# Patient Record
Sex: Male | Born: 1945 | Race: Black or African American | Hispanic: No
Health system: Midwestern US, Community
[De-identification: ages and names within clinical notes are randomized; demographics above are authoritative.]

## PROBLEM LIST (undated history)

## (undated) DIAGNOSIS — IMO0001 Reserved for inherently not codable concepts without codable children: Secondary | ICD-10-CM

## (undated) DIAGNOSIS — T7840XA Allergy, unspecified, initial encounter: Secondary | ICD-10-CM

## (undated) DIAGNOSIS — E538 Deficiency of other specified B group vitamins: Secondary | ICD-10-CM

## (undated) DIAGNOSIS — I10 Essential (primary) hypertension: Secondary | ICD-10-CM

## (undated) DIAGNOSIS — R972 Elevated prostate specific antigen [PSA]: Secondary | ICD-10-CM

## (undated) DIAGNOSIS — D759 Disease of blood and blood-forming organs, unspecified: Secondary | ICD-10-CM

## (undated) DIAGNOSIS — E785 Hyperlipidemia, unspecified: Secondary | ICD-10-CM

## (undated) DIAGNOSIS — C61 Malignant neoplasm of prostate: Secondary | ICD-10-CM

## (undated) DIAGNOSIS — R7303 Prediabetes: Secondary | ICD-10-CM

## (undated) HISTORY — PX: WISDOM TOOTH EXTRACTION: SHX21

## (undated) HISTORY — DX: Disease of blood and blood-forming organs, unspecified: D75.9

## (undated) HISTORY — DX: Allergy, unspecified, initial encounter: T78.40XA

## (undated) HISTORY — DX: Elevated prostate specific antigen (PSA): R97.20

## (undated) HISTORY — DX: Hyperlipidemia, unspecified: E78.5

## (undated) HISTORY — PX: PROSTATE BIOPSY: SHX241

## (undated) HISTORY — DX: Malignant neoplasm of prostate: C61

## (undated) HISTORY — DX: Prediabetes: R73.03

## (undated) HISTORY — DX: Deficiency of other specified B group vitamins: E53.8

## (undated) HISTORY — DX: Essential (primary) hypertension: I10

## (undated) HISTORY — DX: Reserved for inherently not codable concepts without codable children: IMO0001

---

## 2004-02-15 ENCOUNTER — Encounter: Admission: RE | Admit: 2004-02-15 | Discharge: 2004-02-15 | Payer: Self-pay | Admitting: Family Medicine

## 2004-04-08 ENCOUNTER — Encounter: Admission: RE | Admit: 2004-04-08 | Discharge: 2004-04-08 | Payer: Self-pay | Admitting: Internal Medicine

## 2006-07-15 IMAGING — CT CT CHEST W/O CM
1 of 2 series · 16 of 32 positions shown, 20 images · non-contrast
Comparison: none

CLINICAL DATA: Abnormal chest x-ray.  History of asbestos exposure.
 CT OF THE CHEST WITHOUT CONTRAST
TECHNIQUE: Multidetector helical imaging carried out through the chest without contrast.  This exam is correlated with the chest x-ray dated 02/15/04.
 No lung masses of significance are noted.  On image #38, there are two tiny calcifications in the lateral aspect of the right lower lobe consistent with granulomata.  There is some mild pleural thickening noted on the left posteriorly.  No pleural masses or calcified plaque.  No pleural or pericardial fluid.  No definite adenopathy given the limitation of scanning without contrast. 
 IMPRESSION
 1.  Minimal pleural thickening and a few scattered calcified granulomata.  
 2.  No pleural or parenchymal masses.
 3.  No calcified pleural plaques.

[Series 2: — · axial · 0.74mm/px · z∈[-325,-25]mm · 16 of 70 slices shown, 20 images]
[im 5/70  mediastinal]
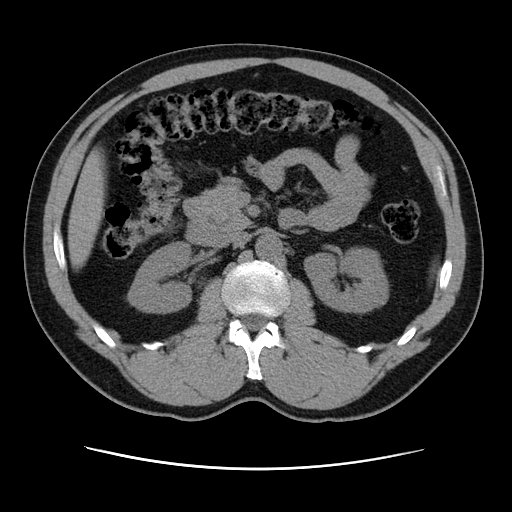
[im 5/70  lung]
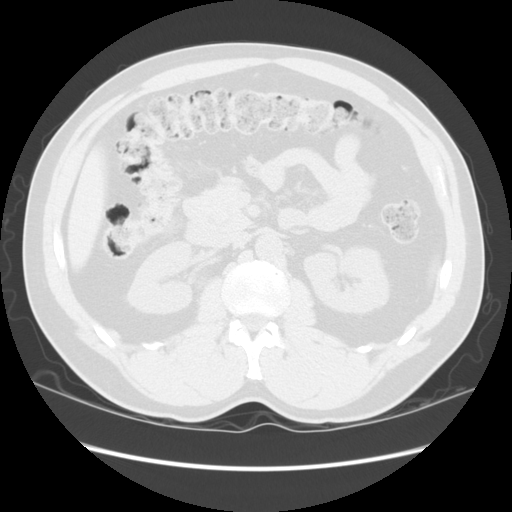
[im 10/70  lung]
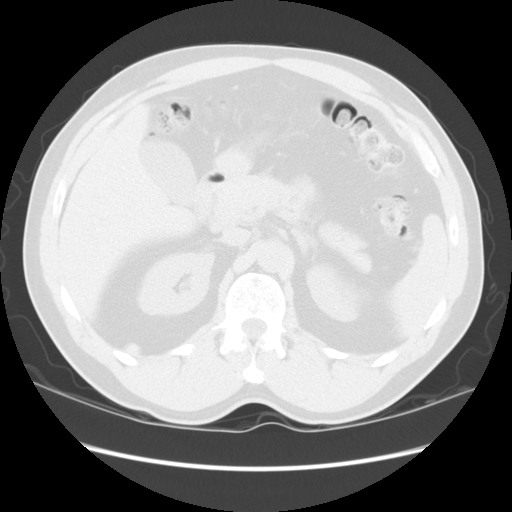
[im 14/70  lung]
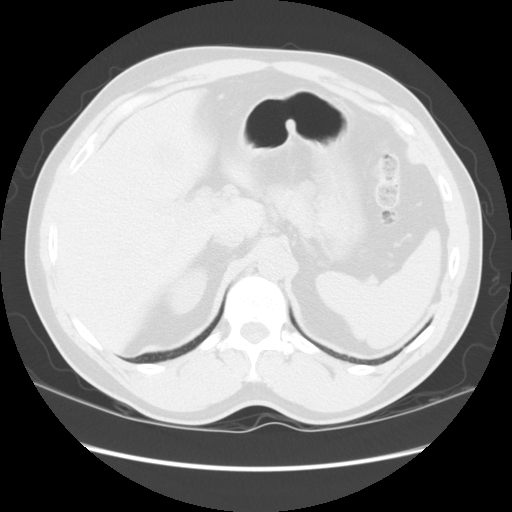
[im 19/70  lung]
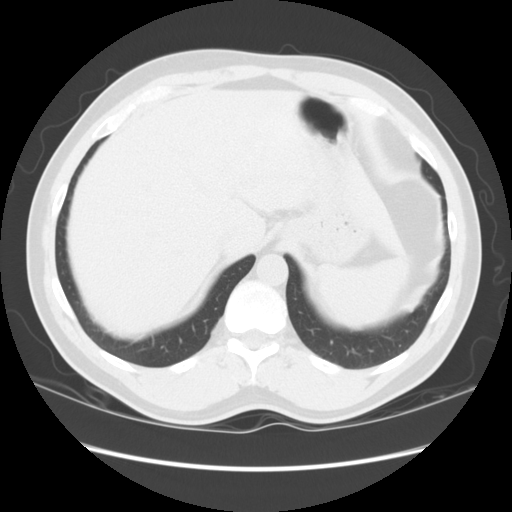
[im 24/70  mediastinal]
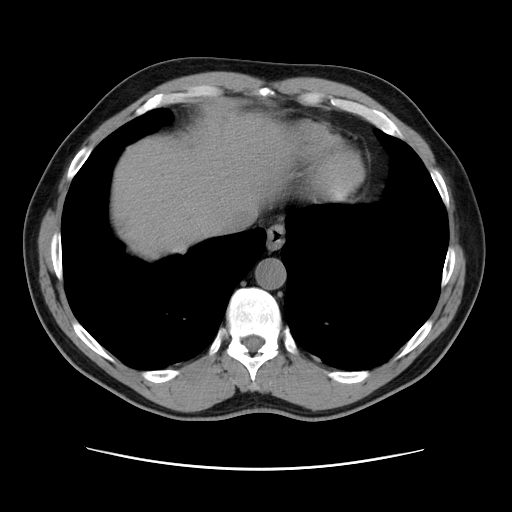
[im 24/70  lung]
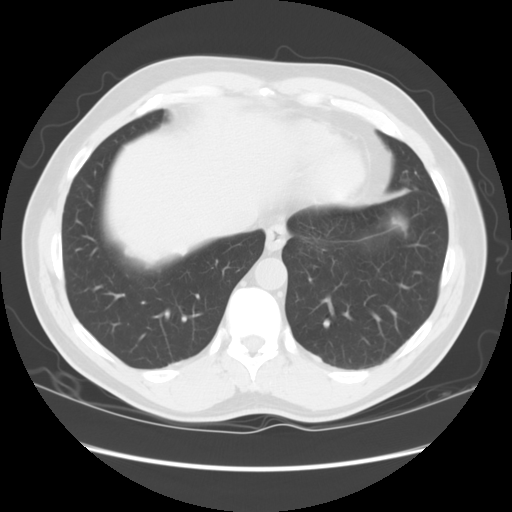
[im 28/70  lung]
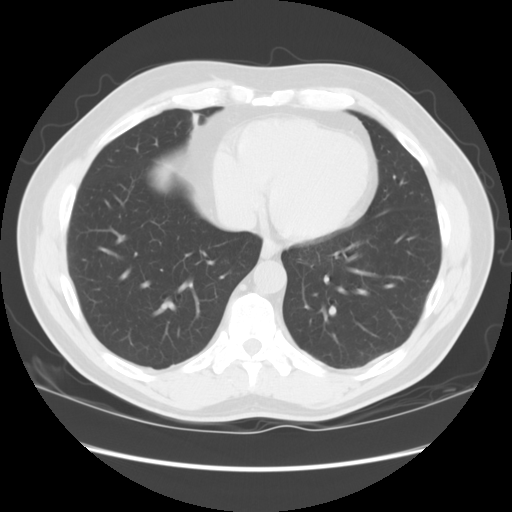
[im 33/70  lung]
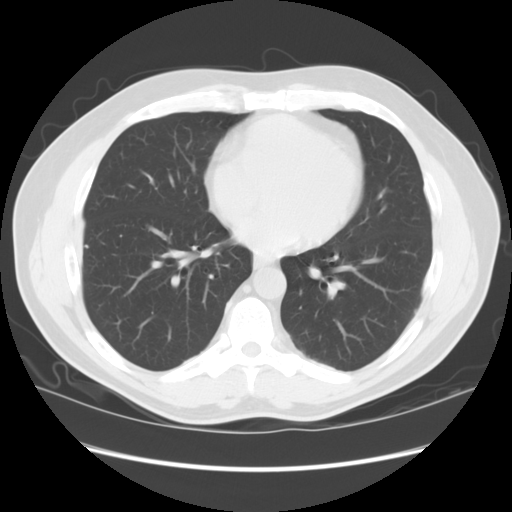
[im 34/70  lung]
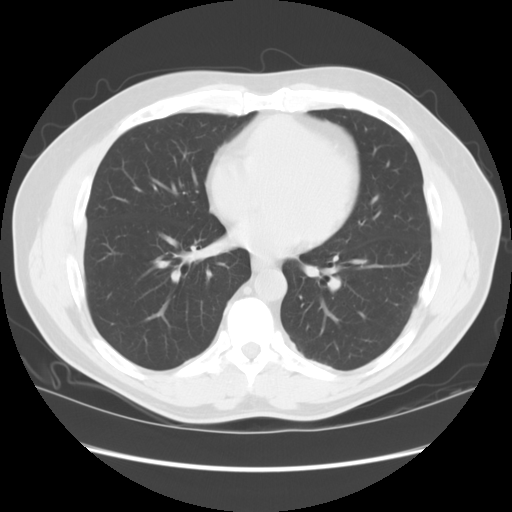
[im 35/70  mediastinal]
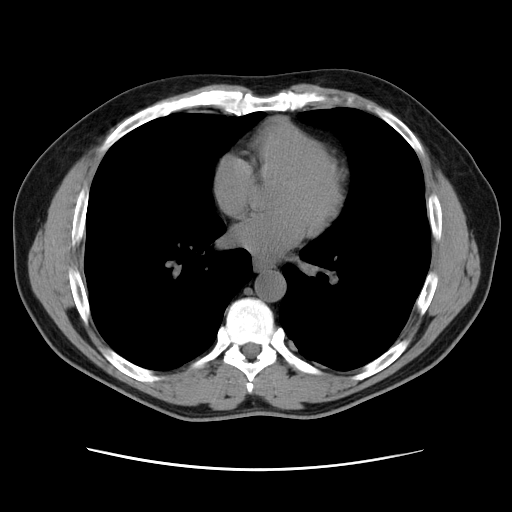
[im 35/70  lung]
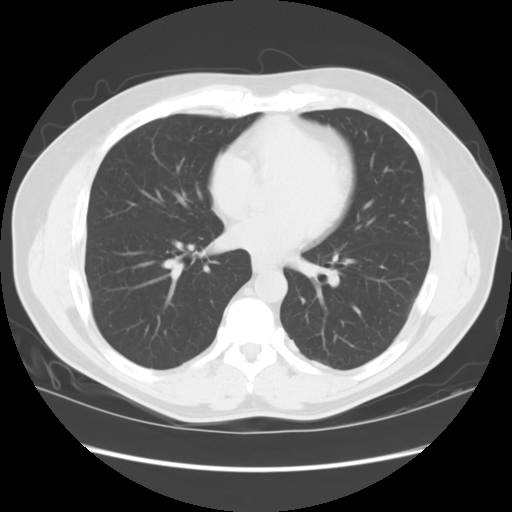
[im 37/70  lung]
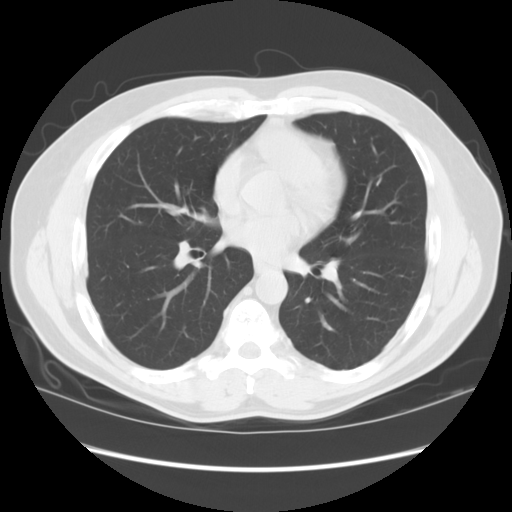
[im 42/70  lung]
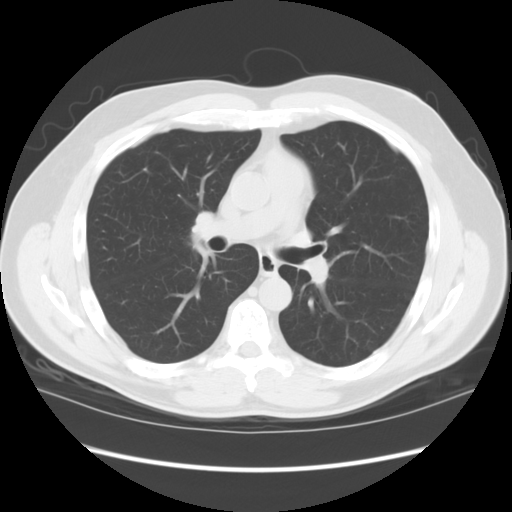
[im 47/70  lung]
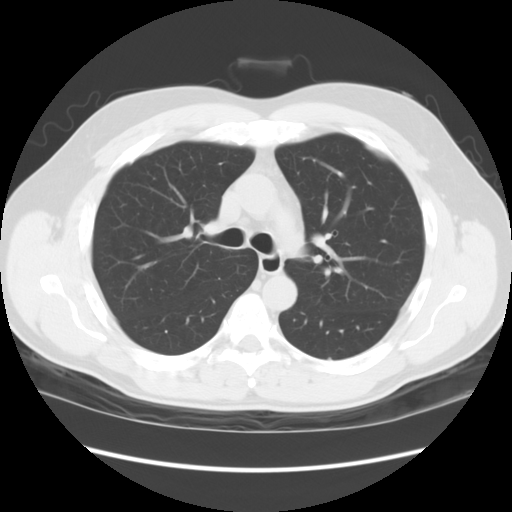
[im 51/70  mediastinal]
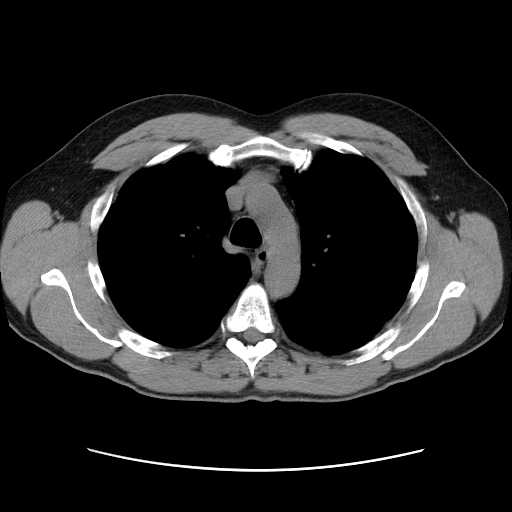
[im 51/70  lung]
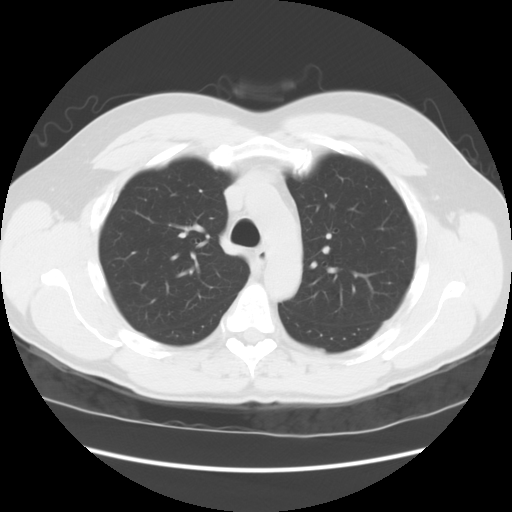
[im 56/70  lung]
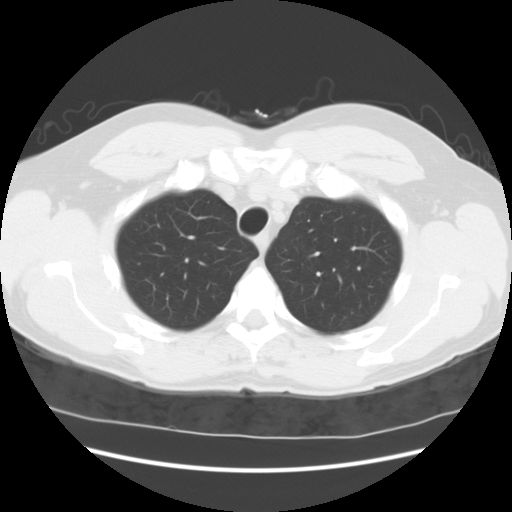
[im 60/70  lung]
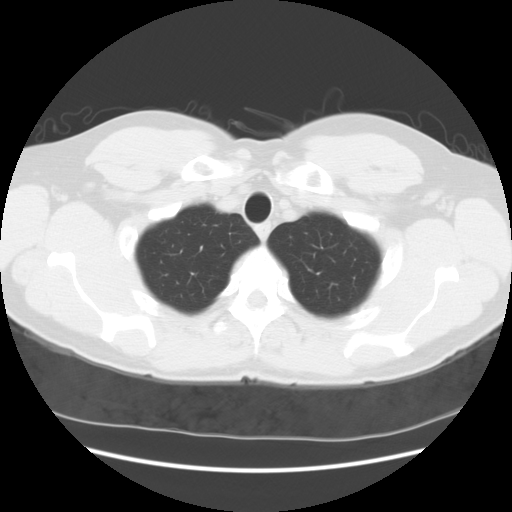
[im 65/70  lung]
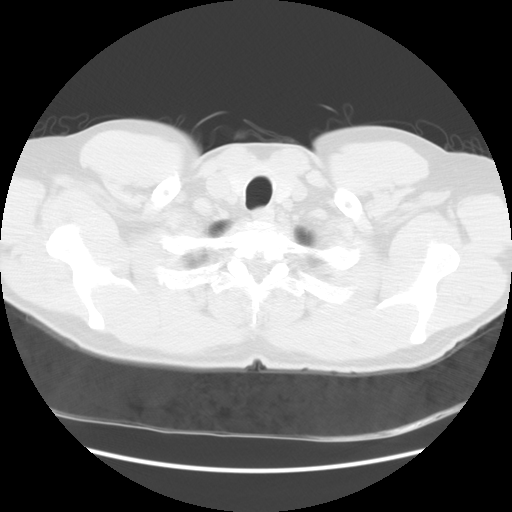

[16 of 32 positions shown; findings below may reference images not displayed]

## 2006-10-19 ENCOUNTER — Ambulatory Visit: Payer: Self-pay | Admitting: Family Medicine

## 2006-10-19 LAB — CONVERTED CEMR LAB
CO2: 34 meq/L — ABNORMAL HIGH (ref 19–32)
Calcium: 10 mg/dL (ref 8.4–10.5)
Chloride: 99 meq/L (ref 96–112)
Cholesterol: 149 mg/dL (ref 0–200)
Creatinine, Ser: 1.1 mg/dL (ref 0.4–1.5)
GFR calc Af Amer: 88 mL/min
Glucose, Bld: 95 mg/dL (ref 70–99)
LDL Cholesterol: 90 mg/dL (ref 0–99)
Potassium: 4 meq/L (ref 3.5–5.1)
Sodium: 140 meq/L (ref 135–145)
Total CHOL/HDL Ratio: 3.5
Triglycerides: 80 mg/dL (ref 0–149)

## 2006-11-24 ENCOUNTER — Ambulatory Visit (HOSPITAL_BASED_OUTPATIENT_CLINIC_OR_DEPARTMENT_OTHER): Admission: RE | Admit: 2006-11-24 | Discharge: 2006-11-24 | Payer: Self-pay | Admitting: Orthopedic Surgery

## 2006-11-24 ENCOUNTER — Encounter (INDEPENDENT_AMBULATORY_CARE_PROVIDER_SITE_OTHER): Payer: Self-pay | Admitting: Specialist

## 2006-12-28 ENCOUNTER — Encounter (INDEPENDENT_AMBULATORY_CARE_PROVIDER_SITE_OTHER): Payer: Self-pay | Admitting: Family Medicine

## 2007-03-09 ENCOUNTER — Ambulatory Visit: Payer: Self-pay | Admitting: Family Medicine

## 2007-03-09 DIAGNOSIS — E785 Hyperlipidemia, unspecified: Secondary | ICD-10-CM | POA: Insufficient documentation

## 2007-03-09 DIAGNOSIS — I10 Essential (primary) hypertension: Secondary | ICD-10-CM

## 2007-03-09 LAB — CONVERTED CEMR LAB
ALT: 23 units/L (ref 0–53)
AST: 23 units/L (ref 0–37)
Direct LDL: 182.1 mg/dL
GFR calc Af Amer: 88 mL/min
GFR calc non Af Amer: 73 mL/min
HDL: 42.5 mg/dL (ref >39.0)
PSA: 1.17 ng/mL (ref 0.10–4.00)

## 2007-03-10 ENCOUNTER — Telehealth (INDEPENDENT_AMBULATORY_CARE_PROVIDER_SITE_OTHER): Payer: Self-pay | Admitting: *Deleted

## 2007-04-14 ENCOUNTER — Ambulatory Visit: Payer: Self-pay | Admitting: Internal Medicine

## 2007-04-26 ENCOUNTER — Ambulatory Visit: Payer: Self-pay | Admitting: Family Medicine

## 2007-04-26 LAB — CONVERTED CEMR LAB
BUN: 22 mg/dL (ref 6–23)
CO2: 33 meq/L — ABNORMAL HIGH (ref 19–32)
Calcium: 9.6 mg/dL (ref 8.4–10.5)
Chloride: 104 meq/L (ref 96–112)
GFR calc non Af Amer: 66 mL/min

## 2007-04-27 ENCOUNTER — Telehealth (INDEPENDENT_AMBULATORY_CARE_PROVIDER_SITE_OTHER): Payer: Self-pay | Admitting: *Deleted

## 2007-04-29 ENCOUNTER — Encounter (INDEPENDENT_AMBULATORY_CARE_PROVIDER_SITE_OTHER): Payer: Self-pay | Admitting: Family Medicine

## 2007-04-29 ENCOUNTER — Ambulatory Visit: Payer: Self-pay | Admitting: Internal Medicine

## 2007-05-11 LAB — HM COLONOSCOPY

## 2007-05-26 ENCOUNTER — Ambulatory Visit: Payer: Self-pay | Admitting: Family Medicine

## 2007-05-26 LAB — CONVERTED CEMR LAB
CO2: 31 meq/L (ref 19–32)
Chloride: 106 meq/L (ref 96–112)
Creatinine, Ser: 1.1 mg/dL (ref 0.4–1.5)
GFR calc non Af Amer: 73 mL/min
Sodium: 143 meq/L (ref 135–145)

## 2007-05-30 ENCOUNTER — Telehealth (INDEPENDENT_AMBULATORY_CARE_PROVIDER_SITE_OTHER): Payer: Self-pay | Admitting: *Deleted

## 2007-06-27 ENCOUNTER — Ambulatory Visit: Payer: Self-pay | Admitting: Family Medicine

## 2007-06-30 ENCOUNTER — Telehealth (INDEPENDENT_AMBULATORY_CARE_PROVIDER_SITE_OTHER): Payer: Self-pay | Admitting: *Deleted

## 2007-06-30 LAB — CONVERTED CEMR LAB
Cholesterol: 153 mg/dL (ref 0–200)
LDL Cholesterol: 93 mg/dL (ref 0–99)
Total CHOL/HDL Ratio: 3.5
Triglycerides: 84 mg/dL (ref 0–149)
VLDL: 17 mg/dL (ref 0–40)

## 2007-08-25 ENCOUNTER — Ambulatory Visit: Payer: Self-pay | Admitting: Family Medicine

## 2009-01-05 DIAGNOSIS — R7303 Prediabetes: Secondary | ICD-10-CM

## 2009-01-05 HISTORY — DX: Prediabetes: R73.03

## 2009-01-23 ENCOUNTER — Ambulatory Visit: Payer: Self-pay | Admitting: Internal Medicine

## 2009-01-24 ENCOUNTER — Ambulatory Visit: Payer: Self-pay | Admitting: Internal Medicine

## 2009-01-28 ENCOUNTER — Telehealth (INDEPENDENT_AMBULATORY_CARE_PROVIDER_SITE_OTHER): Payer: Self-pay | Admitting: *Deleted

## 2009-01-30 ENCOUNTER — Ambulatory Visit: Payer: Self-pay | Admitting: Internal Medicine

## 2009-01-30 LAB — CONVERTED CEMR LAB: Hgb A1c MFr Bld: 5.9 % (ref 4.6–6.5)

## 2009-02-06 ENCOUNTER — Telehealth: Payer: Self-pay | Admitting: Internal Medicine

## 2009-02-06 LAB — CONVERTED CEMR LAB
ALT: 24 units/L (ref 0–53)
BUN: 13 mg/dL (ref 6–23)
Calcium: 9.6 mg/dL (ref 8.4–10.5)
Chloride: 106 meq/L (ref 96–112)
Cholesterol: 224 mg/dL — ABNORMAL HIGH (ref 0–200)
Creatinine, Ser: 1.3 mg/dL (ref 0.4–1.5)
GFR calc non Af Amer: 71.91 mL/min (ref >60)
Potassium: 3.7 meq/L (ref 3.5–5.1)
Sodium: 141 meq/L (ref 135–145)
Triglycerides: 86 mg/dL (ref 0.0–149.0)
VLDL: 17.2 mg/dL (ref 0.0–40.0)

## 2009-05-06 ENCOUNTER — Ambulatory Visit: Payer: Self-pay | Admitting: Internal Medicine

## 2009-05-06 DIAGNOSIS — R739 Hyperglycemia, unspecified: Secondary | ICD-10-CM

## 2009-05-10 LAB — CONVERTED CEMR LAB
Basophils Absolute: 0 10*3/uL (ref 0.0–0.1)
Eosinophils Absolute: 0.1 10*3/uL (ref 0.0–0.7)
HCT: 40.2 % (ref 39.0–52.0)
HDL: 48.4 mg/dL (ref >39.00)
Hgb A1c MFr Bld: 5.9 % (ref 4.6–6.5)
LDL Cholesterol: 84 mg/dL (ref 0–99)
Lymphocytes Relative: 31.3 % (ref 12.0–46.0)
MCV: 86.3 fL (ref 78.0–100.0)
Neutro Abs: 1.8 10*3/uL (ref 1.4–7.7)
RBC: 4.66 M/uL (ref 4.22–5.81)
VLDL: 11.2 mg/dL (ref 0.0–40.0)
WBC: 3.3 10*3/uL — ABNORMAL LOW (ref 4.5–10.5)

## 2009-05-31 ENCOUNTER — Telehealth (INDEPENDENT_AMBULATORY_CARE_PROVIDER_SITE_OTHER): Payer: Self-pay | Admitting: *Deleted

## 2009-06-06 ENCOUNTER — Telehealth (INDEPENDENT_AMBULATORY_CARE_PROVIDER_SITE_OTHER): Payer: Self-pay | Admitting: *Deleted

## 2009-06-06 ENCOUNTER — Ambulatory Visit: Payer: Self-pay | Admitting: Internal Medicine

## 2009-06-07 LAB — CONVERTED CEMR LAB
Eosinophils Absolute: 0.1 10*3/uL (ref 0.0–0.7)
Eosinophils Relative: 1.9 % (ref 0.0–5.0)
HCT: 39 % (ref 39.0–52.0)
Lymphocytes Relative: 29.3 % (ref 12.0–46.0)
MCV: 87.3 fL (ref 78.0–100.0)
Monocytes Absolute: 0.2 10*3/uL (ref 0.1–1.0)
Neutro Abs: 1.6 10*3/uL (ref 1.4–7.7)
Neutrophils Relative %: 61.7 % (ref 43.0–77.0)
WBC: 2.7 10*3/uL — ABNORMAL LOW (ref 4.5–10.5)

## 2009-08-08 ENCOUNTER — Ambulatory Visit: Payer: Self-pay | Admitting: Family Medicine

## 2009-11-27 ENCOUNTER — Ambulatory Visit: Payer: Self-pay | Admitting: Internal Medicine

## 2009-11-27 DIAGNOSIS — N529 Male erectile dysfunction, unspecified: Secondary | ICD-10-CM

## 2009-11-27 DIAGNOSIS — D649 Anemia, unspecified: Secondary | ICD-10-CM | POA: Insufficient documentation

## 2009-12-03 DIAGNOSIS — D72819 Decreased white blood cell count, unspecified: Secondary | ICD-10-CM | POA: Insufficient documentation

## 2009-12-03 LAB — CONVERTED CEMR LAB
Hgb A1c MFr Bld: 6 % (ref 4.6–6.5)
Lymphs Abs: 0.9 10*3/uL (ref 0.7–4.0)
MCHC: 33 g/dL (ref 30.0–36.0)
MCV: 87.7 fL (ref 78.0–100.0)
Monocytes Relative: 13.9 % — ABNORMAL HIGH (ref 3.0–12.0)
Neutrophils Relative %: 36.6 % — ABNORMAL LOW (ref 43.0–77.0)
Platelets: 193 10*3/uL (ref 150.0–400.0)
RBC: 4.51 M/uL (ref 4.22–5.81)
WBC: 2.1 10*3/uL — ABNORMAL LOW (ref 4.5–10.5)

## 2009-12-04 ENCOUNTER — Ambulatory Visit: Payer: Self-pay | Admitting: Internal Medicine

## 2009-12-04 ENCOUNTER — Ambulatory Visit: Payer: Self-pay | Admitting: Hematology & Oncology

## 2009-12-09 ENCOUNTER — Encounter: Payer: Self-pay | Admitting: Internal Medicine

## 2009-12-09 LAB — CBC WITH DIFFERENTIAL (CANCER CENTER ONLY)
BASO#: 0 10*3/uL (ref 0.0–0.2)
Eosinophils Absolute: 0.1 10*3/uL (ref 0.0–0.5)
HCT: 37.7 % — ABNORMAL LOW (ref 38.7–49.9)
HGB: 12.7 g/dL — ABNORMAL LOW (ref 13.0–17.1)
LYMPH#: 1 10*3/uL (ref 0.9–3.3)
LYMPH%: 26.5 % (ref 14.0–48.0)
MCV: 85 fL (ref 82–98)
MONO#: 0.2 10*3/uL (ref 0.1–0.9)
NEUT#: 2.4 10*3/uL (ref 1.5–6.5)
Platelets: 223 10*3/uL (ref 145–400)
RBC: 4.45 10*6/uL (ref 4.20–5.70)
RDW: 11.6 % (ref 10.5–14.6)
WBC: 3.6 10*3/uL — ABNORMAL LOW (ref 4.0–10.0)

## 2010-01-03 ENCOUNTER — Ambulatory Visit: Payer: Self-pay | Admitting: Internal Medicine

## 2010-02-04 ENCOUNTER — Ambulatory Visit: Payer: Self-pay | Admitting: Internal Medicine

## 2010-03-07 ENCOUNTER — Ambulatory Visit: Payer: Self-pay | Admitting: Internal Medicine

## 2010-04-09 ENCOUNTER — Ambulatory Visit: Payer: Self-pay | Admitting: Internal Medicine

## 2010-04-18 ENCOUNTER — Ambulatory Visit: Payer: Self-pay | Admitting: Hematology & Oncology

## 2010-04-30 ENCOUNTER — Ambulatory Visit: Payer: Self-pay | Admitting: Internal Medicine

## 2010-04-30 DIAGNOSIS — R9431 Abnormal electrocardiogram [ECG] [EKG]: Secondary | ICD-10-CM | POA: Insufficient documentation

## 2010-04-30 DIAGNOSIS — E538 Deficiency of other specified B group vitamins: Secondary | ICD-10-CM

## 2010-05-16 ENCOUNTER — Ambulatory Visit: Payer: Self-pay | Admitting: Internal Medicine

## 2010-06-13 ENCOUNTER — Ambulatory Visit: Payer: Self-pay | Admitting: Internal Medicine

## 2010-07-15 ENCOUNTER — Ambulatory Visit: Payer: Self-pay | Admitting: Internal Medicine

## 2010-08-15 ENCOUNTER — Ambulatory Visit: Payer: Self-pay | Admitting: Internal Medicine

## 2010-09-22 ENCOUNTER — Telehealth (INDEPENDENT_AMBULATORY_CARE_PROVIDER_SITE_OTHER): Payer: Self-pay | Admitting: *Deleted

## 2010-10-05 LAB — CONVERTED CEMR LAB
BUN: 14 mg/dL (ref 6–23)
Basophils Relative: 0.5 % (ref 0.0–3.0)
CO2: 30 meq/L (ref 19–32)
Calcium: 9.6 mg/dL (ref 8.4–10.5)
Cholesterol: 142 mg/dL (ref 0–200)
Glucose, Bld: 98 mg/dL (ref 70–99)
HCT: 40.5 % (ref 39.0–52.0)
Hgb A1c MFr Bld: 6 % (ref 4.6–6.5)
LDL Cholesterol: 85 mg/dL (ref 0–99)
Lymphs Abs: 0.9 10*3/uL (ref 0.7–4.0)
MCV: 86.4 fL (ref 78.0–100.0)
Monocytes Relative: 10.9 % (ref 3.0–12.0)
Neutro Abs: 1.7 10*3/uL (ref 1.4–7.7)
PSA: 1.22 ng/mL (ref 0.10–4.00)
Platelets: 208 10*3/uL (ref 150.0–400.0)
RBC: 4.68 M/uL (ref 4.22–5.81)
RDW: 13.6 % (ref 11.5–14.6)
Sodium: 142 meq/L (ref 135–145)
Triglycerides: 38 mg/dL (ref 0.0–149.0)
VLDL: 7.6 mg/dL (ref 0.0–40.0)

## 2010-10-07 NOTE — Assessment & Plan Note (Signed)
Summary: b12 inj//lch   Nurse Visit   Allergies: No Known Drug Allergies  Medication Administration  Injection # 1:    Medication: Vit B12 1000 mcg    Diagnosis: ANEMIA (ICD-285.9)    Route: IM    Site: L deltoid    Exp Date: 11/2011    Lot #: [id number]    Mfr: [employer] Regent    Patient tolerated injection without complications    Given by: Army Fossa CMA (March 07, 2010 3:37 PM)  Orders Added: 1)  Vit B12 1000 mcg [J3420] 2)  Admin of Therapeutic Inj  intramuscular or subcutaneous [60454]

## 2010-10-07 NOTE — Assessment & Plan Note (Signed)
Summary: CPX AND FASTING LABS////SPH   Vital Signs:  Patient profile:   65 year old male Height:      72 inches Weight:      193.50 pounds BMI:     26.34 Pulse rate:   69 / minute Pulse rhythm:   regular BP sitting:   144 / 88  (left arm) Cuff size:   large  Vitals Entered By: Army Fossa CMA (April 30, 2010 8:05 AM) CC: CPX: fasting    History of Present Illness: CPX  Current Medications (verified): 1)  Fish Oil Concentrate 1000 Mg  Caps (Omega-3 Fatty Acids) .... Take As Directed. 2)  Baby Aspirin 81 Mg  Chew (Aspirin) .... Take As Directed. 3)  Vytorin 10-20 Mg Tabs (Ezetimibe-Simvastatin) .Marland Kitchen.. 1 By Mouth At Bedtime 4)  Viagra 100 Mg Tabs (Sildenafil Citrate) .... 1/2 or 1 By Mouth Once Daily As Needed 5)  Amoxicillin 500 Mg Caps (Amoxicillin) .... Per Dentist  Allergies (verified): No Known Drug Allergies  Past History:  Past Medical History: Hyperlipidemia Hypertension Borderline DM A1C 5.9  (May 2010) clinical cytopenia, saw hematology 4/11, observation for now B12 deficiency, mild  Past Surgical History: Reviewed history from 01/23/2009 and no changes required. no major  Family History: Reviewed history from 05/06/2009 and no changes required. DM-- M CAD-- GF age 41, MI at mid 12s  Colon ca--  no prostate ca-- no  Social History: Occupation: Pensions consultant for colostomy products Married 2 children Never Smoked Alcohol use-no Drug use-no Regular exercise-- no diet--improved, lower fat, more greens   Review of Systems General:  Denies fatigue, fever, and weight loss. CV:  Denies chest pain or discomfort, palpitations, and swelling of feet. Resp:  Denies cough and shortness of breath. GI:  Denies bloody stools, nausea, and vomiting. GU:  Denies dysuria, urinary frequency, and urinary hesitancy; occasional  urgency . Psych:  Denies anxiety and depression.  Physical Exam  General:  alert, well-developed, and well-nourished.   Neck:  no  masses, no thyromegaly, and normal carotid upstroke.   Lungs:  normal respiratory effort, no intercostal retractions, no accessory muscle use, and normal breath sounds.   Heart:  normal rate, regular rhythm, no murmur, and no gallop.   Abdomen:  soft, non-tender, no distention, no masses, no guarding, and no rigidity.   Rectal:  No external abnormalities noted. Normal sphincter tone. No rectal masses or tenderness. Prostate:  Prostate gland firm and smooth, no enlargement, nodularity, tenderness, mass, asymmetry or induration. Extremities:  no pretibial edema bilaterally  Psych:  Cognition and judgment appear intact. Alert and cooperative with normal attention span and concentration.  not anxious appearing and not depressed appearing.     Impression & Recommendations:  Problem # 1:  WELL ADULT (ICD-V70.0)  Td 2008 pneumonia shot today (h/o DM) s/p several Cscopes per pt , last Cscope 04-2007 normal (Dr Westley Gambles), next -----10 years  labs  EKG shows sinus bradycardia. Lateral T wave inversions. No old EKGs. patient is asymptomatic.  discussed diet-exercise     Orders: Venipuncture (16109) TLB-Lipid Panel (80061-LIPID) TLB-PSA (Prostate Specific Antigen) (84153-PSA) TLB-BMP (Basic Metabolic Panel-BMET) (80048-METABOL) TLB-Hepatic/Liver Function Pnl (80076-HEPATIC) TLB-CBC Platelet - w/Differential (85025-CBCD) Specimen Handling (60454)  Problem # 2:  ELECTROCARDIOGRAM, ABNORMAL (ICD-794.31)  EKG shows sinus bradycardia. Lateral T wave inversions. No old EKGs. patient is asymptomatic.  Plan: Recheck EKG on  return to the office.  Orders: EKG w/ Interpretation (93000)  Complete Medication List: 1)  Fish Oil Concentrate 1000 Mg Caps (Omega-3 fatty  acids) .... Take as directed. 2)  Baby Aspirin 81 Mg Chew (Aspirin) .... Take as directed. 3)  Vytorin 10-20 Mg Tabs (Ezetimibe-simvastatin) .Marland Kitchen.. 1 by mouth at bedtime 4)  Viagra 100 Mg Tabs (Sildenafil citrate) .... 1/2 or 1 by mouth once  daily as needed 5)  Amoxicillin 500 Mg Caps (Amoxicillin) .... Per dentist  Other Orders: TLB-A1C / Hgb A1C (Glycohemoglobin) (83036-A1C) TLB-B12 + Folate Pnl (16109_60454-U98/JXB) Pneumococcal Vaccine (14782) Admin 1st Vaccine (95621)  Patient Instructions: 1)  Please schedule a follow-up appointment in 6 months .        Immunizations Administered:  Pneumonia Vaccine:    Vaccine Type: Pneumovax    Site: left deltoid    Mfr: Merck    Dose: 0.5 ml    Route: IM    Given by: Army Fossa CMA    Exp. Date: 09/24/2011    Lot #: [id number]

## 2010-10-07 NOTE — Consult Note (Signed)
[facility]   Imported By: Lanelle Bal 01/03/2010 11:52:09  _____________________________________________________________________  External Attachment:    Type:   Image     Comment:   External Document

## 2010-10-07 NOTE — Assessment & Plan Note (Signed)
Summary: b12 shot/kdc   Nurse Visit   Allergies: No Known Drug Allergies  Medication Administration  Injection # 1:    Medication: Vit B12 1000 mcg    Diagnosis: ANEMIA (ICD-285.9)    Route: IM    Site: L deltoid    Exp Date: 10/2011    Lot #: [id number]    Patient tolerated injection without complications    Given by: Shary Decamp (January 03, 2010 3:28 PM)  Orders Added: 1)  Vit B12 1000 mcg [J3420] 2)  Admin of Therapeutic Inj  intramuscular or subcutaneous [96372]   Medication Administration  Injection # 1:    Medication: Vit B12 1000 mcg    Diagnosis: ANEMIA (ICD-285.9)    Route: IM    Site: L deltoid    Exp Date: 10/2011    Lot #: [id number]    Patient tolerated injection without complications    Given by: Shary Decamp (January 03, 2010 3:28 PM)  Orders Added: 1)  Vit B12 1000 mcg [J3420] 2)  Admin of Therapeutic Inj  intramuscular or subcutaneous [16109]

## 2010-10-07 NOTE — Assessment & Plan Note (Signed)
Summary: B12/DRB   Nurse Visit  CC: B-12 inj./kb   Allergies: No Known Drug Allergies  Medication Administration  Injection # 1:    Medication: Vit B12 1000 mcg    Diagnosis: B12 DEFICIENCY (ICD-266.2)    Route: IM    Site: R deltoid    Exp Date: 11/06/2011    Lot #: [id number]    Mfr: [employer]    Patient tolerated injection without complications    Given by: Lucious Groves CMA (June 13, 2010 4:01 PM)  Orders Added: 1)  Vit B12 1000 mcg [J3420] 2)  Admin of Therapeutic Inj  intramuscular or subcutaneous [16109]

## 2010-10-07 NOTE — Assessment & Plan Note (Signed)
Summary: B-12//PH   Nurse Visit  CC: Vitamin B-12 inj./kb   Allergies: No Known Drug Allergies  Medication Administration  Injection # 1:    Medication: Vit B12 1000 mcg    Diagnosis: B12 DEFICIENCY (ICD-266.2)    Route: IM    Site: L deltoid    Exp Date: 11/06/2011    Lot #: [id number]    Mfr: [employer]ent    Patient tolerated injection without complications    Given by: Lucious Groves CMA (May 16, 2010 4:00 PM)  Orders Added: 1)  Vit B12 1000 mcg [J3420] 2)  Admin of Therapeutic Inj  intramuscular or subcutaneous [91478]

## 2010-10-07 NOTE — Assessment & Plan Note (Signed)
Summary: B-12 SHOT////SPH   Nurse Visit   Allergies: No Known Drug Allergies  Medication Administration  Injection # 1:    Medication: Vit B12 1000 mcg    Diagnosis: ANEMIA (ICD-285.9)    Route: IM    Site: L deltoid    Exp Date: 10/2011    Lot #: [id number]    Patient tolerated injection without complications    Given by: Shary Decamp (Feb 04, 2010 3:41 PM)  Orders Added: 1)  Vit B12 1000 mcg [J3420] 2)  Admin of Therapeutic Inj  intramuscular or subcutaneous [09811]

## 2010-10-07 NOTE — Assessment & Plan Note (Signed)
Summary: B12/KN   Nurse Visit   Allergies: No Known Drug Allergies  Medication Administration  Injection # 1:    Medication: Vit B12 1000 mcg    Diagnosis: ANEMIA (ICD-285.9)    Route: IM    Site: L deltoid    Exp Date: 11/2011    Lot #: [id number]    Mfr: [employer]    Patient tolerated injection without complications    Given by: Army Fossa CMA (April 09, 2010 3:33 PM)  Orders Added: 1)  Vit B12 1000 mcg [J3420] 2)  Admin of Therapeutic Inj  intramuscular or subcutaneous [16109]

## 2010-10-07 NOTE — Assessment & Plan Note (Signed)
Summary: 6 MONTH OV//PH   Vital Signs:  Patient profile:   65 year old male Height:      72 inches Weight:      194.4 pounds Pulse rate:   82 / minute BP sitting:   148 / 86  Vitals Entered By: Shary Decamp (November 27, 2009 3:32 PM) CC: rov - no concerns   History of Present Illness: routine office visit Hyperlipidemia good medication compliance  diet is healthy not exercising  recently   Hypertension-- no ambulatory BPs   Borderline DM -- on no meds    Current Medications (verified): 1)  Fish Oil Concentrate 1000 Mg  Caps (Omega-3 Fatty Acids) .... Take As Directed. 2)  Baby Aspirin 81 Mg  Chew (Aspirin) .... Take As Directed. 3)  Vytorin 10-20 Mg Tabs (Ezetimibe-Simvastatin) .Marland Kitchen.. 1 By Mouth At Bedtime  Allergies (verified): No Known Drug Allergies  Past History:  Past Medical History: Reviewed history from 05/06/2009 and no changes required. Hyperlipidemia Hypertension Borderline DM A1C 5.9  (May 2010)  Past Surgical History: Reviewed history from 01/23/2009 and no changes required. no major  Social History: Reviewed history from 01/23/2009 and no changes required. 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 GU:  also complained of a long history of difficulty with erections he tried Viagra remotely without apparent side effects, would like to try again.  No history of heart disease.  Physical Exam  General:  alert and well-developed.   Lungs:  normal respiratory effort, no intercostal retractions, no accessory muscle use, and normal breath sounds.   Heart:  normal rate, regular rhythm, no murmur, and no gallop.   Extremities:  no pretibial edema bilaterally    Impression & Recommendations:  Problem # 1:  ANEMIA (ICD-285.9) see previous CBCs labs     Orders: Venipuncture  (45409) TLB-B12 + Folate Pnl (81191_47829-F62/ZHY) TLB-IBC Pnl (Iron/FE;Transferrin) (83550-IBC) TLB-CBC Platelet - w/Differential (85025-CBCD)  Problem # 2:  DIABETES MELLITUS, BORDERLINE (ICD-790.29) borderline elevation of sugars, encourage diet and exercise, labs Labs Reviewed: Creat: 1.3 (01/24/2009)     Orders: TLB-A1C / Hgb A1C (Glycohemoglobin) (83036-A1C)  Problem # 3:  ERECTILE DYSFUNCTION, ORGANIC (ICD-607.84) Assessment: New see ROS, likes to try Viagra one more time plan: Trial  w/ Viagra again side effects, interaction with nitroglycerin and how to use Viagra was discussed with the patient  His updated medication list for this problem includes:    Viagra 100 Mg Tabs (Sildenafil citrate) .Marland Kitchen... 1/2 or 1 by mouth once daily as needed  Problem # 4:  HYPERTENSION (ICD-401.9) on no medication, see instructions BP today: 148/86 Prior BP: 130/82 (08/08/2009)  Labs Reviewed: K+: 3.7 (01/24/2009) Creat: : 1.3 (01/24/2009)   Chol: 144 (05/06/2009)   HDL: 48.40 (05/06/2009)   LDL: 84 (05/06/2009)   TG: 56.0 (05/06/2009)  Complete Medication List: 1)  Fish Oil Concentrate 1000 Mg Caps (Omega-3 fatty acids) .... Take as directed. 2)  Baby Aspirin 81 Mg Chew (Aspirin) .... Take as directed. 3)  Vytorin 10-20 Mg Tabs (Ezetimibe-simvastatin) .Marland Kitchen.. 1 by mouth at bedtime 4)  Viagra 100 Mg Tabs (Sildenafil citrate) .... 1/2 or 1 by mouth once daily as needed  Patient Instructions: 1)  Check your blood pressure 2 or 3 times a week. If it is more than 140/85 consistently,please let us know  2)  Please  schedule a follow-up appointment in 5  months  (physical) Prescriptions: VIAGRA 100 MG TABS (SILDENAFIL CITRATE) 1/2 or 1 by mouth once daily as needed  #10 x 0   Entered and Authorized by:   Dennis Quick E. Celest Reitz MD   Signed by:   Dennis Rod. Kentrell Hallahan MD on 11/27/2009   Method used:   Print then Give to Patient   RxID:   [id number]

## 2010-10-07 NOTE — Assessment & Plan Note (Signed)
Summary: B12/KN   Nurse Visit  CC: B-12 inj./kb   Allergies: No Known Drug Allergies  Medication Administration  Injection # 1:    Medication: Vit B12 1000 mcg    Diagnosis: B12 DEFICIENCY (ICD-266.2)    Route: IM    Site: L deltoid    Exp Date: 11/06/2010    Lot #: [id number]    Mfr: [employer] Regent    Patient tolerated injection without complications    Given by: Lucious Groves CMA (July 15, 2010 3:47 PM)  Orders Added: 1)  Vit B12 1000 mcg [J3420] 2)  Admin of Therapeutic Inj  intramuscular or subcutaneous [16109]

## 2010-10-07 NOTE — Assessment & Plan Note (Signed)
Summary: b12/swh   Nurse Visit   Allergies: No Known Drug Allergies  Medication Administration  Injection # 1:    Medication: Vit B12 1000 mcg    Diagnosis: ANEMIA (ICD-285.9)    Route: IM    Site: L deltoid    Exp Date: 07/09/2011    Lot #: [id number]    Mfr: [employer] Regent    Given by: Doristine Devoid (December 04, 2009 3:55 PM)  Orders Added: 1)  Vit B12 1000 mcg [J3420] 2)  Admin of Therapeutic Inj  intramuscular or subcutaneous [96372]   Medication Administration  Injection # 1:    Medication: Vit B12 1000 mcg    Diagnosis: ANEMIA (ICD-285.9)    Route: IM    Site: L deltoid    Exp Date: 07/09/2011    Lot #: [id number]    Mfr: [employer] Regent    Given by: Doristine Devoid (December 04, 2009 3:55 PM)  Orders Added: 1)  Vit B12 1000 mcg [J3420] 2)  Admin of Therapeutic Inj  intramuscular or subcutaneous [52841]

## 2010-10-09 NOTE — Progress Notes (Signed)
Summary: Refill Request  Phone Note Refill Request Call back at [phone] Message from:  Pharmacy on September 22, 2010 8:13 AM  Refills Requested: Medication #1:  VYTORIN 10-20 MG TABS 1 by mouth at bedtime   Dosage confirmed as above?Dosage Confirmed   Supply Requested: 90   Last Refilled: 04/16/2010 [facility] [address] in [place]  Next Appointment Scheduled: 2.20.12 Initial call taken by: Harold Barban,  September 22, 2010 8:13 AM    Prescriptions: VYTORIN 10-20 MG TABS (EZETIMIBE-SIMVASTATIN) 1 by mouth at bedtime  #90 x 0   Entered by:   Army Fossa CMA   Authorized by:   Nolon Rod. Paz MD   Signed by:   Army Fossa CMA on 09/22/2010   Method used:   Electronically to        [facility] [address] (retail)       [address]       [place], [place]  [address]       Ph: [phone]       Fax: [phone]   RxID:   [id number]

## 2010-10-27 ENCOUNTER — Ambulatory Visit (INDEPENDENT_AMBULATORY_CARE_PROVIDER_SITE_OTHER): Payer: BC Managed Care – PPO | Admitting: Internal Medicine

## 2010-10-27 ENCOUNTER — Encounter: Payer: Self-pay | Admitting: Internal Medicine

## 2010-10-27 ENCOUNTER — Other Ambulatory Visit: Payer: Self-pay | Admitting: Internal Medicine

## 2010-10-27 DIAGNOSIS — R7309 Other abnormal glucose: Secondary | ICD-10-CM

## 2010-10-27 DIAGNOSIS — R9431 Abnormal electrocardiogram [ECG] [EKG]: Secondary | ICD-10-CM

## 2010-10-27 DIAGNOSIS — E538 Deficiency of other specified B group vitamins: Secondary | ICD-10-CM

## 2010-10-29 LAB — MICROALBUMIN / CREATININE URINE RATIO
Creatinine,U: 141.9 mg/dL
Microalb, Ur: 0.7 mg/dL (ref 0.0–1.9)

## 2010-11-04 NOTE — Assessment & Plan Note (Signed)
Summary: 6 MONTH FU/KN   Vital Signs:  Patient profile:   65 year old male Weight:      199.38 pounds Pulse rate:   76 / minute Pulse rhythm:   regular BP sitting:   136 / 82  (left arm) Cuff size:   large  Vitals Entered By: Army Fossa CMA (October 27, 2010 3:49 PM) CC: 4 month f/u- not fasting  Comments no complaints  [facility] [place]    History of Present Illness: ROV doing well , no concerns does not check CBGs   Current Medications (verified): 1)  Fish Oil Concentrate 1000 Mg  Caps (Omega-3 Fatty Acids) .... Take As Directed. 2)  Baby Aspirin 81 Mg  Chew (Aspirin) .... Take As Directed. 3)  Vytorin 10-20 Mg Tabs (Ezetimibe-Simvastatin) .Marland Kitchen.. 1 By Mouth At Bedtime 4)  Viagra 100 Mg Tabs (Sildenafil Citrate) .... 1/2 or 1 By Mouth Once Daily As Needed  Allergies (verified): No Known Drug Allergies  Past History:  Past Medical History: Hyperlipidemia Hypertension Borderline DM A1C 5.9  (May 2010) clinical cytopenia, saw hematology 4/11, observation for now B12 deficiency, mild  Past Surgical History: Reviewed history from 01/23/2009 and no changes required. no major  Family History: Reviewed history from 05/06/2009 and no changes required. DM-- M CAD-- GF age 66, MI at mid 38s  Colon ca--  no prostate ca-- no  Social History: Reviewed history from 04/30/2010 and no changes required. 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 CV:  Denies chest pain or discomfort, palpitations, and swelling of feet. Resp:  Denies cough and shortness of breath.  Physical Exam  General:  alert, well-developed, and well-nourished.   Lungs:  normal respiratory effort, no intercostal retractions, no accessory muscle use, and normal breath sounds.   Heart:  normal rate, regular rhythm, no murmur, and  no gallop.   Extremities:  no pretibial edema bilaterally  Psych:  not anxious appearing and not depressed appearing.     Impression & Recommendations:  Problem # 1:  ELECTROCARDIOGRAM, ABNORMAL (ICD-794.31)  repeated EKG today unchanged , has TWI lateral leads is possible that at some point he had silent ischemia plan: stress test  Orders: EKG w/ Interpretation (93000) Cardiology Referral (Cardiology)  Problem # 2:  B12 DEFICIENCY (ICD-266.2) B12 wnl on no suplements, recheck from time to time   Problem # 3:  DIABETES MELLITUS, BORDERLINE (ICD-790.29) labs  Orders: Venipuncture (57846) TLB-A1C / Hgb A1C (Glycohemoglobin) (83036-A1C) TLB-Microalbumin/Creat Ratio, Urine (82043-MALB)  Labs Reviewed: Creat: 1.0 (04/30/2010)     Complete Medication List: 1)  Fish Oil Concentrate 1000 Mg Caps (Omega-3 fatty acids) .... Take as directed. 2)  Baby Aspirin 81 Mg Chew (Aspirin) .... Take as directed. 3)  Vytorin 10-20 Mg Tabs (Ezetimibe-simvastatin) .Marland Kitchen.. 1 by mouth at bedtime 4)  Viagra 100 Mg Tabs (Sildenafil citrate) .... 1/2 or 1 by mouth once daily as needed  Patient Instructions: 1)  Please schedule a follow-up appointment in 6 months .    Orders Added: 1)  Venipuncture [36415] 2)  TLB-A1C / Hgb A1C (Glycohemoglobin) [83036-A1C] 3)  TLB-Microalbumin/Creat Ratio, Urine [82043-MALB] 4)  EKG w/ Interpretation [93000] 5)  Cardiology Referral [Cardiology] 6)  Est. Patient Level III [96295]

## 2010-11-06 ENCOUNTER — Telehealth (INDEPENDENT_AMBULATORY_CARE_PROVIDER_SITE_OTHER): Payer: Self-pay | Admitting: Radiology

## 2010-11-06 DIAGNOSIS — IMO0001 Reserved for inherently not codable concepts without codable children: Secondary | ICD-10-CM

## 2010-11-06 HISTORY — DX: Reserved for inherently not codable concepts without codable children: IMO0001

## 2010-11-10 ENCOUNTER — Encounter: Payer: Self-pay | Admitting: Cardiovascular Disease

## 2010-11-10 ENCOUNTER — Encounter: Payer: Self-pay | Admitting: Internal Medicine

## 2010-11-10 ENCOUNTER — Ambulatory Visit (HOSPITAL_COMMUNITY): Payer: BC Managed Care – PPO | Attending: Internal Medicine

## 2010-11-10 DIAGNOSIS — R9431 Abnormal electrocardiogram [ECG] [EKG]: Secondary | ICD-10-CM | POA: Insufficient documentation

## 2010-11-10 DIAGNOSIS — E119 Type 2 diabetes mellitus without complications: Secondary | ICD-10-CM | POA: Insufficient documentation

## 2010-11-13 NOTE — Progress Notes (Signed)
Summary: Nuclear Pre-Procedure  Phone Note Outgoing Call Call back at [facility] Phone [phone]   Call placed by: Stanton Kidney, EMT-P,  November 06, 2010 11:12 AM Action Taken: Phone Call Completed Reason for Call: Confirm/change Appt Summary of Call: Left message with information on [employer] Information Sheet (see scanned document for details). Stanton Kidney, EMT-P  November 06, 2010 11:12 AM      Nuclear Med Background Indications for Stress Test: Evaluation for Ischemia   History: Abnormal EKG      Nuclear Pre-Procedure Cardiac Risk Factors: Hypertension, Lipids, NIDDM Height (in): 72

## 2010-11-18 NOTE — Assessment & Plan Note (Signed)
Summary: cardiolite/dx.abd ekg,dm/w.199/dr.paz,jose per rena [id number]  Nuclear Med Background Indications for Stress Test: Evaluation for Ischemia   History: Abnormal EKG   Symptoms: Palpitations    Nuclear Pre-Procedure Cardiac Risk Factors: Hypertension, Lipids, NIDDM Caffeine/Decaff Intake: None NPO After: 7:00 PM Lungs: clear IV 0.9% NS with Angio Cath: 18g     IV Site: R Antecubital IV Started by: Stanton Kidney, EMT-P Chest Size (in) 42     Height (in): 73 Weight (lb): 192 BMI: 25.42  Nuclear Med Study 1 or 2 day study:  1 day     Stress Test Type:  Stress Reading MD:  Dietrich Pates, MD     Referring MD:  J.Paz Resting Radionuclide:  Technetium 59m Tetrofosmin     Resting Radionuclide Dose:  10.7 mCi  Stress Radionuclide:  Technetium 64m Tetrofosmin     Stress Radionuclide Dose:  33 mCi   Stress Protocol Exercise Time (min):  6:00 min     Max HR:  146 bpm     Predicted Max HR:  157 bpm  Max Systolic BP: 209 mm Hg     Percent Max HR:  92.99 %     METS: 7.0 Rate Pressure Product:  16109    Stress Test Technologist:  Milana Na, EMT-P     Nuclear Technologist:  Domenic Polite, CNMT  Rest Procedure  Myocardial perfusion imaging was performed at rest 45 minutes following the intravenous administration of Technetium 64m Tetrofosmin.  Stress Procedure  The patient exercised for 6:00. The patient stopped due to fatigue and denied any chest pain.  There were non specific ST-T wave changes and a rare pvc.  Technetium 94m Tetrofosmin was injected at peak exercise and myocardial perfusion imaging was performed after a brief delay.  QPS Raw Data Images:  Stress and rest images were motion corrected.  Soft tissue (diaphragm) underlies heart. Stress Images:  Thinning with decreased counts in the inferior wall (base) and apex.  Otherwise normal perfusion. Rest Images:  Minimal improvment in the basal inferior and apex.  Overall does not appear to represent significant  ischemia. Subtraction (SDS):  NO ischemia. Transient Ischemic Dilatation:  0.96  (Normal <1.22)  Lung/Heart Ratio:  0.29  (Normal <0.45)  Quantitative Gated Spect Images QGS EDV:  91 ml QGS ESV:  37 ml QGS EF:  59 %   Overall Impression  Exercise Capacity: Good exercise capacity. BP Response: Normal blood pressure response. Clinical Symptoms: No chest pain ECG Impression: 1 to 2 mm flat to downsloping ST depression in V5/V6 in Stage II.  Became insignif transiently then returrend.  Developd 1 mm ST depression III, AVF at peak  Near normalized at 30 sec recovery. Overall Impression Comments: Probable normal perfusion and soft tissue attenuation.  No ischemia.  Appended Document: cardiolite/dx.abd ekg,dm/w.199/dr.paz,jose per rena [id number]/mj advise patient: no evidence of CAD good results, call us if problems, CP-SOB  Appended Document: Cardio Results (lmom 3/7)    left message for pt to call back. Army Fossa CMA  November 12, 2010 3:08 PM patient aware of results.Marland KitchenMarland KitchenMarland KitchenDoristine Devoid CMA  November 12, 2010 4:42 PM

## 2011-01-23 NOTE — Op Note (Signed)
Dennis Huff, Dennis Huff                 ACCOUNT NO.:  [id number]   MEDICAL RECORD NO.:  [id number]          PATIENT TYPE:  AMB   LOCATION:  DSC                          FACILITY:  [facility]   PHYSICIAN:  Cindee Salt, M.D.       DATE OF BIRTH:  1946-05-08   DATE OF PROCEDURE:  11/24/2006  DATE OF DISCHARGE:                               OPERATIVE REPORT   PREOPERATIVE DIAGNOSIS:  Mucoid cyst, left thumb.   POSTOPERATIVE DIAGNOSIS:  Mucoid cyst, left thumb.   OPERATION:  Excision, mucoid cyst and debridement, interphalangeal  joint, left thumb.   SURGEON:  Cindee Salt, MD   ASSISTANT:  Carolyne Fiscal, R.N.   ANESTHESIA:  Forearm-based IV regional.   HISTORY:  The patient is a 65 year old male with a history of a mass  over the IP joint of his left thumb.  This transilluminates.  He shows  degenerative changes.  He is aware of risks and complications of  excision of the cyst, debridement of the interphalangeal joint,  including infection, recurrence, injury to arteries, nerves, tendons  incomplete relief of symptoms, dystrophy, recurrence of the cyst,  continued degenerative changes.  In the preoperative area questions are  encouraged and answered, the extremity marked by both the patient and  surgeon.   PROCEDURE:  The patient is brought to the operating room, where a  forearm-based IV regional anesthetic was carried out without difficulty.  He was prepped using DuraPrep, supine position, left arm free.  After  adequate anesthesia was afforded to the patient, a curvilinear incision  was made over the IP joint of the thumb, carried down through  subcutaneous tissue.  Bleeders were electrocauterized.  The cyst was  immediately encountered with blunt and sharp dissection and this was  dissected free and sent to pathology.  The joint was then opened.  The  area of osteophyte formation was removed with a small rongeur.  A  complete synovectomy was performed.  No further lesions were identified.  The  wound was copiously irrigated with saline.  The skin was then closed  interrupted 5-0 nylon sutures.  A sterile compressive dressing and  splint to the finger was applied.  The patient tolerated the procedure  well was taken to the recovery observation in satisfactory condition.  He is discharged home to return to the [facility] in 1  week on Vicodin.           ______________________________  Cindee Salt, M.D.     GK/MEDQ  D:  11/24/2006  T:  11/24/2006  Job:  [id number]

## 2011-01-23 NOTE — Assessment & Plan Note (Signed)
[facility]                        [place] OFFICE NOTE   NAME:Dennis Huff, Dennis Huff                        MRN:          [id number]  DATE:10/19/2006                            DOB:          [birth year]    REASON FOR VISIT:  Establish care.   Dennis Huff is a 65 year old male here to establish care from [facility] [employer]. He has a history of hypertension and  hyperlipidemia. He denies any side effects from the medication and has  been doing well overall.   He has noticed a small lump on the left thumb. He reports that he uses  his hands extensively and prior to Thanksgiving he noticed a painless  knot on his left thumb. He denies any trauma.   PAST MEDICAL HISTORY:  1. Hypertension.  2. Hyperlipidemia.   PAST SURGICAL HISTORY:  None.   MEDICATIONS:  1. Hydrochlorothiazide 25 mg daily.  2. Vytorin 10/40 daily.  3. Fish oil capsule 1000 mg daily.  4. Aspirin 81 mg daily.   ALLERGIES:  No known drug allergies.   FAMILY HISTORY:  Mother passed away with a history of type 2 diabetes.  Unknown history of his father.   SOCIAL HISTORY:  The patient works for [employer] as a Pensions consultant.  He is married with 2 children who are alive and well. He denies any  tobacco or alcohol use.   HEALTH MAINTENANCE:  The patient reports that he had a full colonoscopy  done last year and it was unremarkable as was a PSA.   REVIEW OF SYSTEMS:  As per HPI otherwise within normal limits.   OBJECTIVE:  VITAL SIGNS:  Weight 183.2, pulse 78. Blood pressure  initially was 130/90, at the end of the visit it was 130/82.  GENERAL:  We have a pleasant male in no acute distress who answers  questions appropriately.  NECK:  Supple, no lymphadenopathy, carotid bruits or JVD. No  thyromegaly.  LUNGS:  Clear.  HEART:  Regular rate and rhythm, no murmur, gallop or rub.  EXTREMITIES:  No clubbing, cyanosis or edema. Examination of the left  thumb is  significant for a 1 cm cystic nodule over the DIP joint. No  tenderness to palpation. The patient has full range of motion.   IMPRESSION:  1. Hypertension.  2. Hyperlipidemia.  3. Cystic nodule of the left thumb, possible ganglion cyst.   PLAN:  1. Will obtain nonfasting BMET and lipid profile as well as AST and      ALT.  2. The patient will get prescriptions refilled as needed.  3. The patient will followup in 3 months for a complete physical      examination.  4. He will have his medical records transferred from [place] in the      meantime.  5. Referral to hand specialist.     Leanne Chang, M.D.  Electronically Signed    LA/MedQ  DD: 10/19/2006  DT: 10/19/2006  Job #: [id number]

## 2011-04-20 ENCOUNTER — Other Ambulatory Visit: Payer: Self-pay | Admitting: Internal Medicine

## 2011-04-20 NOTE — Telephone Encounter (Signed)
Rx Done. OV scheduled 04/27/11

## 2011-04-24 ENCOUNTER — Encounter: Payer: Self-pay | Admitting: Internal Medicine

## 2011-04-27 ENCOUNTER — Encounter: Payer: Self-pay | Admitting: *Deleted

## 2011-04-27 ENCOUNTER — Ambulatory Visit (INDEPENDENT_AMBULATORY_CARE_PROVIDER_SITE_OTHER): Payer: BC Managed Care – PPO | Admitting: Internal Medicine

## 2011-04-27 DIAGNOSIS — E785 Hyperlipidemia, unspecified: Secondary | ICD-10-CM

## 2011-04-27 DIAGNOSIS — I10 Essential (primary) hypertension: Secondary | ICD-10-CM

## 2011-04-27 DIAGNOSIS — R7309 Other abnormal glucose: Secondary | ICD-10-CM

## 2011-04-27 NOTE — Assessment & Plan Note (Signed)
Good compliance with medication.

## 2011-04-27 NOTE — Progress Notes (Signed)
  Subjective:    Patient ID: Dennis Huff, male    DOB: 04/13/1946, 65 y.o.   MRN: [id number]  HPI Routine office visit, feeling well, no concerns. He's not fasting. Past Medical History  Diagnosis Date  . B12 deficiency     mild   . Hyperlipidemia   . Hypertension   . 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 Normal cardiac stress test 11-2010     done for an abnormal EKG   Past Surgical History  Procedure Date  . None reported      Review of Systems Good medication compliance with Vytorin, no apparent side effects Denies any chest pain or shortness of breath Denies any blurred vision, polyuria or polydipsia.    Objective:   Physical Exam  Constitutional: He is oriented to person, place, and time. He appears well-developed and well-nourished. No distress.  Cardiovascular: Normal rate, regular rhythm and normal heart sounds.   No murmur heard. Pulmonary/Chest: Effort normal and breath sounds normal. No respiratory distress. He has no wheezes. He has no rales.  Musculoskeletal: He exhibits no edema.  Neurological: He is alert and oriented to person, place, and time.  Skin: He is not diaphoretic.          Assessment & Plan:

## 2011-04-27 NOTE — Assessment & Plan Note (Signed)
On no medication, BP today normal.

## 2011-04-27 NOTE — Assessment & Plan Note (Addendum)
All previous hemoglobin A1c tests discussed, counseled about the meaning of borderline diabetes, diet and exercise. Will refer to a nutritionist

## 2011-05-06 ENCOUNTER — Other Ambulatory Visit: Payer: Self-pay | Admitting: Internal Medicine

## 2011-05-06 DIAGNOSIS — E119 Type 2 diabetes mellitus without complications: Secondary | ICD-10-CM

## 2011-05-27 ENCOUNTER — Other Ambulatory Visit: Payer: Self-pay | Admitting: Internal Medicine

## 2011-05-27 MED ORDER — EZETIMIBE-SIMVASTATIN 10-20 MG PO TABS: 1.0000 | ORAL_TABLET | Freq: Every day | ORAL | Status: DC

## 2011-05-27 NOTE — Telephone Encounter (Signed)
[facility] to [facility].Marland KitchenMarland Kitchen9/19/12@11 :07am/LMB

## 2011-07-27 ENCOUNTER — Encounter: Payer: Self-pay | Admitting: Internal Medicine

## 2011-07-27 ENCOUNTER — Ambulatory Visit (INDEPENDENT_AMBULATORY_CARE_PROVIDER_SITE_OTHER): Payer: BC Managed Care – PPO | Admitting: Internal Medicine

## 2011-07-27 DIAGNOSIS — Z0000 Encounter for general adult medical examination without abnormal findings: Secondary | ICD-10-CM | POA: Insufficient documentation

## 2011-07-27 DIAGNOSIS — Z23 Encounter for immunization: Secondary | ICD-10-CM

## 2011-07-27 DIAGNOSIS — I10 Essential (primary) hypertension: Secondary | ICD-10-CM

## 2011-07-27 DIAGNOSIS — Z2911 Encounter for prophylactic immunotherapy for respiratory syncytial virus (RSV): Secondary | ICD-10-CM

## 2011-07-27 DIAGNOSIS — E538 Deficiency of other specified B group vitamins: Secondary | ICD-10-CM

## 2011-07-27 MED ORDER — EZETIMIBE-SIMVASTATIN 10-20 MG PO TABS: 1.0000 | ORAL_TABLET | Freq: Every day | ORAL | Status: DC

## 2011-07-27 NOTE — Progress Notes (Signed)
  Subjective:    Patient ID: Dennis Huff, male    DOB: 1945/12/02, 65 y.o.   MRN: [id number]  HPI CPX  Past Medical History  Diagnosis Date  . B12 deficiency     mild   . Hyperlipidemia   . Hypertension   . 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 Normal cardiac stress test 11-2010     done for an abnormal EKG   Past Surgical History  Procedure Date  . None reported    History   Social History  . Marital Status: Married    Spouse Name: N/A    Number of Children: 2  . Years of Education: N/A   Occupational History  . colostomy tech [employer]   Social History Main Topics  . Smoking status: Never Smoker   . Smokeless tobacco: Never Used  . Alcohol Use: No  . Drug Use: No  . Sexually Active: Not on file   Other Topics Concern  . Not on file   Social History Narrative   Occupation: Pensions consultant for [employer]----Regular Exercise---noDiet--improved     Family History  Problem Relation Age of Onset  . Diabetes Mother   . Coronary artery disease Other     GF---> MI at around 64 y/o  . Colon cancer Neg Hx   . Prostate cancer Neg Hx      Review of Systems URI sx x 4 days, already getting better, no fever, some cough No  CP-SOB No blood in the stools No dysuria-no diff urinating-no gross hematuria    Objective:   Physical Exam  Constitutional: He is oriented to person, place, and time. He appears well-developed and well-nourished. No distress.  HENT:  Head: Normocephalic and atraumatic.  Neck: No thyromegaly present.  Cardiovascular: Normal rate, regular rhythm and normal heart sounds.   No murmur heard. Pulmonary/Chest: Effort normal and breath sounds normal. No respiratory distress. He has no wheezes. He has no rales.  Abdominal: Soft. Bowel sounds are normal. He exhibits no distension. There is no tenderness. There is no rebound and no guarding.  Genitourinary: Rectum normal and prostate  normal.  Musculoskeletal: He exhibits no edema.  Neurological: He is alert and oriented to person, place, and time.  Skin: Skin is warm and dry. He is not diaphoretic.  Psychiatric: He has a normal mood and affect. His behavior is normal. Judgment and thought content normal.       Assessment & Plan:  URI-- see instructions

## 2011-07-27 NOTE — Assessment & Plan Note (Signed)
Labs

## 2011-07-27 NOTE — Patient Instructions (Addendum)
Rest, fluids , tylenol For cough, take Mucinex DM twice a day as needed  Call if no better in few days Call anytime if the symptoms are severe, you have high fever, short of breath  Get a flu shot once you are better Came back fasting: FLP-CMP CBC -TSH-PSA  ---dx V70 B12--- dx B12 def A1C-- dx borderline DM

## 2011-07-27 NOTE — Assessment & Plan Note (Signed)
No change 

## 2011-07-27 NOTE — Assessment & Plan Note (Addendum)
Td 2008 Had a pneumonia shot already Shingles shot-- provided today Flu shot recommended, will get at work s/p several Cscopes per pt , last Cscope 04-2007 normal (Dr Westley Gambles), next -----10 years  labs  discussed diet-exercise

## 2011-07-28 ENCOUNTER — Other Ambulatory Visit (INDEPENDENT_AMBULATORY_CARE_PROVIDER_SITE_OTHER): Payer: BC Managed Care – PPO

## 2011-07-28 DIAGNOSIS — E538 Deficiency of other specified B group vitamins: Secondary | ICD-10-CM

## 2011-07-28 DIAGNOSIS — E119 Type 2 diabetes mellitus without complications: Secondary | ICD-10-CM

## 2011-07-28 DIAGNOSIS — Z0000 Encounter for general adult medical examination without abnormal findings: Secondary | ICD-10-CM

## 2011-07-28 NOTE — Progress Notes (Signed)
12  

## 2011-07-29 LAB — COMPREHENSIVE METABOLIC PANEL
Albumin: 3.8 g/dL (ref 3.5–5.2)
Alkaline Phosphatase: 86 U/L (ref 39–117)
BUN: 19 mg/dL (ref 6–23)
CO2: 29 mEq/L (ref 19–32)
Chloride: 104 mEq/L (ref 96–112)
Creatinine, Ser: 1.1 mg/dL (ref 0.4–1.5)
GFR: 83.86 mL/min (ref >60.00)
Total Bilirubin: 1.1 mg/dL (ref 0.3–1.2)
Total Protein: 6.9 g/dL (ref 6.0–8.3)

## 2011-07-29 LAB — CBC WITH DIFFERENTIAL/PLATELET
Lymphs Abs: 0.8 10*3/uL (ref 0.7–4.0)
MCHC: 33.7 g/dL (ref 30.0–36.0)
Monocytes Relative: 17.1 % — ABNORMAL HIGH (ref 3.0–12.0)
Neutro Abs: 1.1 10*3/uL — ABNORMAL LOW (ref 1.4–7.7)
Neutrophils Relative %: 45.5 % (ref 43.0–77.0)
RBC: 4.27 Mil/uL (ref 4.22–5.81)
WBC: 2.4 10*3/uL — ABNORMAL LOW (ref 4.5–10.5)

## 2011-07-29 LAB — LIPID PANEL: HDL: 38.7 mg/dL — ABNORMAL LOW (ref >39.00)

## 2011-07-29 LAB — PSA: PSA: 1.18 ng/mL (ref 0.10–4.00)

## 2011-09-20 ENCOUNTER — Other Ambulatory Visit: Payer: Self-pay | Admitting: Internal Medicine

## 2013-04-26 ENCOUNTER — Encounter: Payer: Self-pay | Admitting: Internal Medicine

## 2013-04-26 ENCOUNTER — Ambulatory Visit (INDEPENDENT_AMBULATORY_CARE_PROVIDER_SITE_OTHER): Payer: BC Managed Care – PPO | Admitting: Internal Medicine

## 2013-04-26 VITALS — BP 130/70 | HR 73 | Temp 98.4°F | Wt 197.0 lb

## 2013-04-26 DIAGNOSIS — N508 Other specified disorders of male genital organs: Secondary | ICD-10-CM

## 2013-04-26 DIAGNOSIS — K4090 Unilateral inguinal hernia, without obstruction or gangrene, not specified as recurrent: Secondary | ICD-10-CM

## 2013-04-26 DIAGNOSIS — R7309 Other abnormal glucose: Secondary | ICD-10-CM

## 2013-04-26 DIAGNOSIS — E785 Hyperlipidemia, unspecified: Secondary | ICD-10-CM

## 2013-04-26 DIAGNOSIS — N5089 Other specified disorders of the male genital organs: Secondary | ICD-10-CM | POA: Insufficient documentation

## 2013-04-26 NOTE — Patient Instructions (Addendum)
Please come back fasting: FLP, AST, ALT hyperlipidemia A1c BMP hyperglycemia

## 2013-04-26 NOTE — Assessment & Plan Note (Addendum)
New incidental finding when examining him for hernia. Check a  ultrasound

## 2013-04-26 NOTE — Assessment & Plan Note (Signed)
Last A1c was more than a year ago, will check labs

## 2013-04-26 NOTE — Assessment & Plan Note (Signed)
Run out of medications about a year ago,labs

## 2013-04-26 NOTE — Progress Notes (Signed)
  Subjective:    Patient ID: Dennis Huff, male    DOB: [birth year], 67 y.o.   MRN: [id number]  HPI Acute visit 9 days history of discomfort in the left suprapubic area, swelling? No discrete mass. 3 days prior to the onset of symptoms he did some heavy lifting. Since the patient is here, we also discussed other issues: Hyperlipidemia, ran out of medications long ago. Borderline diabetes, trying to eat healthy, not exercising regulalrly  Past Medical History  Diagnosis Date  . B12 deficiency     mild   . Hyperlipidemia   . Hypertension   . 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 Normal cardiac stress test 11-2010     done for an abnormal EKG   Past Surgical History  Procedure Laterality Date  . None reported        Review of Systems No fever chills. No nausea, vomiting, abdominal pain. No pain in the testicle or testicular swelling. No GU rash     Objective:   Physical Exam  Constitutional: He appears well-developed and well-nourished. No distress.  Abdominal: Soft. Bowel sounds are normal. He exhibits no distension and no mass. There is no tenderness. There is no rebound and no guarding.  Genitourinary:     Normal exam of the right groin  Skin: He is not diaphoretic.       Assessment & Plan:

## 2013-04-26 NOTE — Assessment & Plan Note (Signed)
Suspect left inguinal hernia, refer to surgery for confirmation and possible treatment

## 2013-04-27 ENCOUNTER — Encounter: Payer: Self-pay | Admitting: Internal Medicine

## 2013-04-27 ENCOUNTER — Other Ambulatory Visit (INDEPENDENT_AMBULATORY_CARE_PROVIDER_SITE_OTHER): Payer: BC Managed Care – PPO

## 2013-04-27 DIAGNOSIS — R7309 Other abnormal glucose: Secondary | ICD-10-CM

## 2013-04-27 DIAGNOSIS — R739 Hyperglycemia, unspecified: Secondary | ICD-10-CM

## 2013-04-27 DIAGNOSIS — E785 Hyperlipidemia, unspecified: Secondary | ICD-10-CM

## 2013-04-28 ENCOUNTER — Ambulatory Visit (HOSPITAL_BASED_OUTPATIENT_CLINIC_OR_DEPARTMENT_OTHER)
Admission: RE | Admit: 2013-04-28 | Discharge: 2013-04-28 | Disposition: A | Discharge status: Home / Self Care | Payer: BC Managed Care – PPO | Source: Ambulatory Visit | Attending: Internal Medicine | Admitting: Internal Medicine

## 2013-04-28 ENCOUNTER — Other Ambulatory Visit: Payer: Self-pay | Admitting: Internal Medicine

## 2013-04-28 DIAGNOSIS — N433 Hydrocele, unspecified: Secondary | ICD-10-CM | POA: Insufficient documentation

## 2013-04-28 DIAGNOSIS — N5089 Other specified disorders of the male genital organs: Secondary | ICD-10-CM

## 2013-04-28 DIAGNOSIS — N508 Other specified disorders of male genital organs: Secondary | ICD-10-CM | POA: Insufficient documentation

## 2013-04-28 LAB — BASIC METABOLIC PANEL
BUN: 14 mg/dL (ref 6–23)
CO2: 29 mEq/L (ref 19–32)
Calcium: 9.4 mg/dL (ref 8.4–10.5)
Chloride: 105 mEq/L (ref 96–112)
Creatinine, Ser: 1 mg/dL (ref 0.4–1.5)

## 2013-04-28 LAB — LDL CHOLESTEROL, DIRECT: Direct LDL: 165.8 mg/dL

## 2013-04-28 LAB — LIPID PANEL
HDL: 47.6 mg/dL (ref >39.00)
VLDL: 13.8 mg/dL (ref 0.0–40.0)

## 2013-04-28 LAB — HEMOGLOBIN A1C: Hgb A1c MFr Bld: 6 % (ref 4.6–6.5)

## 2013-04-28 LAB — ALT: ALT: 25 U/L (ref 0–53)

## 2013-04-28 LAB — AST: AST: 28 U/L (ref 0–37)

## 2013-04-30 ENCOUNTER — Telehealth: Payer: Self-pay | Admitting: Internal Medicine

## 2013-04-30 DIAGNOSIS — E785 Hyperlipidemia, unspecified: Secondary | ICD-10-CM

## 2013-04-30 NOTE — Telephone Encounter (Signed)
Advise patient Borderline elevation of blood sugar is a stable. Cholesterol is again moderately elevated, the LDL is 165, should be close to 100. The ultrasound of the testicles showed a benign condition (Epididymal cyst) Plan: Recommend to start Lipitor 20 mg one tablet every day, #30 and 3 refills. Please arrange FLP, AST, ALT in 2 months. Dx Hyperlipidemia

## 2013-05-01 ENCOUNTER — Other Ambulatory Visit: Payer: Self-pay | Admitting: Internal Medicine

## 2013-05-01 DIAGNOSIS — R229 Localized swelling, mass and lump, unspecified: Secondary | ICD-10-CM

## 2013-05-01 MED ORDER — ATORVASTATIN CALCIUM 20 MG PO TABS: 20.0000 mg | ORAL_TABLET | Freq: Every day | ORAL | Status: DC

## 2013-05-01 NOTE — Telephone Encounter (Signed)
Pt notified and orders placed. Lipitor ordered.

## 2013-05-01 NOTE — Telephone Encounter (Signed)
Called Pt and LMOVM to return call.

## 2013-05-03 ENCOUNTER — Ambulatory Visit (INDEPENDENT_AMBULATORY_CARE_PROVIDER_SITE_OTHER): Payer: BC Managed Care – PPO | Admitting: General Surgery

## 2013-05-03 ENCOUNTER — Encounter (INDEPENDENT_AMBULATORY_CARE_PROVIDER_SITE_OTHER): Payer: Self-pay | Admitting: General Surgery

## 2013-05-03 VITALS — BP 126/75 | HR 78 | Temp 97.8°F | Resp 12 | Ht 72.0 in | Wt 197.8 lb

## 2013-05-03 DIAGNOSIS — R1032 Left lower quadrant pain: Secondary | ICD-10-CM

## 2013-05-03 NOTE — Progress Notes (Signed)
Subjective:   Left groin discomfort, possible hernia  Patient ID: Dennis Huff, male   DOB: [birth year], 67 y.o.   MRN: [id number]  HPI Patient is a very pleasant 67 year old male referred by Dr. Drue Novel. He states that about 2 weeks ago he developed some discomfort and even mild pain in his left groin. He thought he might have still some swelling or a lump. He saw Dr. Drue Novel who was concerned about a possible left inguinal hernia. He also was found to have a small mass in his upper scrotum. The patient reports that he has had imaging showing this to be a simple cyst. I do not have that result. He states that over the last couple weeks he is feeling significantly better and now just has some mild discomfort. He had been doing some heavy lifting prior to the onset of his symptoms. He's had no previous history of hernias or hernia repairs. No urinary symptoms.  Past Medical History  Diagnosis Date  . B12 deficiency     mild   . Hyperlipidemia   . Hypertension   . 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 Normal cardiac stress test 11-2010     done for an abnormal EKG   Past Surgical History  Procedure Laterality Date  . None reported     Current Outpatient Prescriptions  Medication Sig Dispense Refill  . aspirin 81 MG tablet Take 81 mg by mouth daily.        Marland Kitchen atorvastatin (LIPITOR) 20 MG tablet Take 1 tablet (20 mg total) by mouth daily.  30 tablet  3   No current facility-administered medications for this visit.   No Known Allergies   Review of Systems  Constitutional: Negative.   Respiratory: Negative.   Cardiovascular: Negative.   Gastrointestinal: Negative.   Genitourinary: Negative.        Objective:   Physical Exam BP 126/75  Pulse 78  Temp(Src) 97.8 F (36.6 C) (Temporal)  Resp 12  Ht 6' (1.829 m)  Wt 197 lb 12.8 oz (89.721 kg)  BMI 26.82 kg/m2 General: Alert, well-developed [place] male, in no  distress Skin: Warm and dry without rash or infection. HEENT: No palpable masses or thyromegaly. Sclera nonicteric. Pupils equal round and reactive.  Lymph nodes: No cervical, supraclavicular, or inguinal nodes palpable. Lungs: Breath sounds clear and equal without increased work of breathing Cardiovascular: Regular rate and rhythm without murmur. No JVD or edema. Peripheral pulses intact. Abdomen: Nondistended. Soft and nontender. No masses palpable. No organomegaly. On careful examination of the groins both internally and externally I cannot feel a hernia. Nontender. GU: There is approximately 1 cm smooth rounded mass in the upper scrotum in the area of the epididymis on the left side. Extremities: No edema or joint swelling or deformity. No chronic venous stasis changes. Neurologic: Alert and fully oriented. Gait normal.    Assessment:     Left groin pain and symptoms suggestive of a left inguinal hernia but I cannot confirm a hernia today on exam. He may well have just a muscle strain that is improving. He has a small cyst in his left hemiscrotum apparently by imaging not sure how this could be contributing to his symptoms.    Plan:     Watchful waiting for now. He will let us know how he is doing in 4-6 weeks. If he has persistent symptoms he will return  for reevaluation and origin to call sooner should he have any worsening symptoms. He was given literature regarding inguinal hernias and symptoms to be aware of but at this point I cannot confirm a hernia.

## 2013-05-03 NOTE — Patient Instructions (Addendum)
If symptoms worsen or you notice a definite bulge call for an appointment. If you're not feeling better after 4-6 weeks call for an appointment for a recheck. At this time we cannot detect a definite hernia.

## 2013-06-05 ENCOUNTER — Encounter: Payer: BC Managed Care – PPO | Admitting: Internal Medicine

## 2013-07-04 ENCOUNTER — Other Ambulatory Visit (INDEPENDENT_AMBULATORY_CARE_PROVIDER_SITE_OTHER): Payer: BC Managed Care – PPO

## 2013-07-04 DIAGNOSIS — E785 Hyperlipidemia, unspecified: Secondary | ICD-10-CM

## 2013-07-05 LAB — LIPID PANEL
Cholesterol: 141 mg/dL (ref 0–200)
LDL Cholesterol: 81 mg/dL (ref 0–99)
Triglycerides: 52 mg/dL (ref 0.0–149.0)

## 2013-07-07 ENCOUNTER — Encounter: Payer: Self-pay | Admitting: *Deleted

## 2013-07-13 ENCOUNTER — Telehealth: Payer: Self-pay

## 2013-07-13 NOTE — Telephone Encounter (Addendum)
Left message for call back Non identifiable  Medication List and allergies: reviewed and updated  90 day supply/mail order: na Local prescriptions: [facility] [address] [place]  Immunizations due: UTD  A/P:   No changes to FH or PSH Flu at work PNA--2011 Tdap--03/2007 CCS--05/2007--Dr Juanda Chance      Shingles--07/2011 PSA--07/2011--1.18  To Discuss with Provider: Not at this time

## 2013-07-14 ENCOUNTER — Ambulatory Visit (INDEPENDENT_AMBULATORY_CARE_PROVIDER_SITE_OTHER): Payer: BC Managed Care – PPO | Admitting: Internal Medicine

## 2013-07-14 ENCOUNTER — Encounter: Payer: Self-pay | Admitting: Internal Medicine

## 2013-07-14 VITALS — BP 143/82 | HR 64 | Temp 98.3°F | Ht 72.0 in | Wt 197.8 lb

## 2013-07-14 DIAGNOSIS — K4090 Unilateral inguinal hernia, without obstruction or gangrene, not specified as recurrent: Secondary | ICD-10-CM

## 2013-07-14 DIAGNOSIS — I10 Essential (primary) hypertension: Secondary | ICD-10-CM

## 2013-07-14 DIAGNOSIS — N5089 Other specified disorders of the male genital organs: Secondary | ICD-10-CM

## 2013-07-14 DIAGNOSIS — D72819 Decreased white blood cell count, unspecified: Secondary | ICD-10-CM

## 2013-07-14 DIAGNOSIS — Z0000 Encounter for general adult medical examination without abnormal findings: Secondary | ICD-10-CM

## 2013-07-14 DIAGNOSIS — E538 Deficiency of other specified B group vitamins: Secondary | ICD-10-CM

## 2013-07-14 LAB — CBC WITH DIFFERENTIAL/PLATELET
Basophils Absolute: 0 10*3/uL (ref 0.0–0.1)
Basophils Relative: 1 % (ref 0–1)
Eosinophils Absolute: 0.1 10*3/uL (ref 0.0–0.7)
Lymphocytes Relative: 24 % (ref 12–46)
Lymphs Abs: 1 10*3/uL (ref 0.7–4.0)
MCH: 28.8 pg (ref 26.0–34.0)
MCHC: 34.3 g/dL (ref 30.0–36.0)
Monocytes Absolute: 0.3 10*3/uL (ref 0.1–1.0)
Neutro Abs: 2.8 10*3/uL (ref 1.7–7.7)
Neutrophils Relative %: 66 % (ref 43–77)
Platelets: 254 10*3/uL (ref 150–400)
RBC: 4.72 MIL/uL (ref 4.22–5.81)

## 2013-07-14 MED ORDER — ATORVASTATIN CALCIUM 20 MG PO TABS: 20.0000 mg | ORAL_TABLET | Freq: Every day | ORAL | Status: DC

## 2013-07-14 NOTE — Progress Notes (Signed)
Pre visit review using our clinic review tool, if applicable. No additional management support is needed unless otherwise documented below in the visit note. 

## 2013-07-14 NOTE — Progress Notes (Signed)
  Subjective:    Patient ID: COE ANGELOS, male    DOB: [birth year], 67 y.o.   MRN: [id number]  HPI CPX  Past Medical History  Diagnosis Date  . B12 deficiency     mild   . Hyperlipidemia   . Hypertension   . 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 Normal cardiac stress test 11-2010     done for an abnormal EKG   Past Surgical History  Procedure Laterality Date  . None reported     History   Social History  . Marital Status: Married    Spouse Name: N/A    Number of Children: 2  . Years of Education: N/A   Occupational History  . colostomy tech [employer]   Social History Main Topics  . Smoking status: Never Smoker   . Smokeless tobacco: Never Used  . Alcohol Use: Yes     Comment: rarely  . Drug Use: No  . Sexual Activity: Not on file   Other Topics Concern  . Not on file   Social History Narrative   Occupation: Pensions consultant for [employer]                       Family History  Problem Relation Age of Onset  . Diabetes Mother   . Coronary artery disease Other     GF---> MI at around 66 y/o  . Colon cancer Neg Hx   . Prostate cancer Neg Hx   . Stroke Neg Hx     Review of Systems Diet-- average Exercise-- nothing regular  No  CP, SOB, lower extremity edema Denies  nausea, vomiting diarrhea Denies  blood in the stools (-) cough, sputum production. (-) wheezing, chest congestion Some allergies, sneezing, better w/ OTCs No dysuria, gross hematuria, difficulty urinating   No anxiety, depression      Objective:   Physical Exam BP 143/82  Pulse 64  Temp(Src) 98.3 F (36.8 C)  Ht 6' (1.829 m)  Wt 197 lb 12.8 oz (89.721 kg)  BMI 26.82 kg/m2  SpO2 98% General -- alert, well-developed, NAD.  Neck --no thyromegaly Lungs -- normal respiratory effort, no intercostal retractions, no accessory muscle use, and normal breath sounds.  Heart-- normal rate, regular rhythm, no murmur.  Abdomen--  Not distended, good bowel sounds,soft, non-tender. No mass,organomegaly. Rectal-- No external abnormalities noted. Normal sphincter tone. No rectal masses or tenderness. Brown stool Prostate--Prostate gland firm and smooth, no enlargement, nodularity, tenderness, mass, asymmetry or induration. Extremities-- no pretibial edema bilaterally  Neurologic--  alert & oriented X3. Speech normal, gait normal, strength normal in all extremities.  Psych-- Cognition and judgment appear intact. Cooperative with normal attention span and concentration. No anxious appearing , no depressed appearing.    Assessment & Plan:

## 2013-07-14 NOTE — Assessment & Plan Note (Signed)
History of B12 deficiency, Labs

## 2013-07-14 NOTE — Assessment & Plan Note (Addendum)
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 Plan: Monitor   CBCs

## 2013-07-14 NOTE — Assessment & Plan Note (Signed)
BP not in issue at this point

## 2013-07-14 NOTE — Assessment & Plan Note (Signed)
Recent ultrasound showed the following: IMPRESSION:  1. Moderate size bilateral hydroceles containing diffuse internal  echoes.  2. Bilateral heterogeneity of the epidydimi as well as a 9 mm left  epididymal cyst.

## 2013-07-14 NOTE — Assessment & Plan Note (Addendum)
Td 2008 Had a pneumonia  Had a Shingles shot Flu shot  at work s/p several Cscopes per pt , last Cscope 04-2007 normal (Dr Westley Gambles), next -----10 years  labs  discussed diet-exercise

## 2013-07-14 NOTE — Patient Instructions (Signed)
Get your blood work before you leave  Next visit in 6 months for a   follow up . No Fasting Please make an appointment

## 2013-07-14 NOTE — Assessment & Plan Note (Signed)
Saw surgery, not clear if he indeed has a hernia, they recommend observation.

## 2013-07-15 LAB — FOLATE: Folate: >20 ng/mL

## 2013-07-15 LAB — PSA: PSA: 1.96 ng/mL (ref <=4.00)

## 2013-07-15 LAB — TSH: TSH: 1.365 u[IU]/mL (ref 0.350–4.500)

## 2013-07-24 ENCOUNTER — Encounter: Payer: Self-pay | Admitting: *Deleted

## 2014-01-12 ENCOUNTER — Ambulatory Visit (INDEPENDENT_AMBULATORY_CARE_PROVIDER_SITE_OTHER): Payer: BC Managed Care – PPO | Admitting: Internal Medicine

## 2014-01-12 ENCOUNTER — Encounter: Payer: Self-pay | Admitting: Internal Medicine

## 2014-01-12 VITALS — BP 133/63 | HR 81 | Temp 98.2°F | Wt 201.2 lb

## 2014-01-12 DIAGNOSIS — R7309 Other abnormal glucose: Secondary | ICD-10-CM

## 2014-01-12 DIAGNOSIS — E785 Hyperlipidemia, unspecified: Secondary | ICD-10-CM

## 2014-01-12 DIAGNOSIS — Z23 Encounter for immunization: Secondary | ICD-10-CM

## 2014-01-12 NOTE — Assessment & Plan Note (Signed)
Pre-diabetes, has not changed his lifestyle but plans to do so. Encouraged to stay active and eat healthy

## 2014-01-12 NOTE — Progress Notes (Signed)
Pre visit review using our clinic review tool, if applicable. No additional management support is needed unless otherwise documented below in the visit note. 

## 2014-01-12 NOTE — Patient Instructions (Signed)
Please schedule fasting labs at your convenience A1c diabetes FLP hyperlipidemia  Next visit for a complete physical exam November 2015, please make an appointment

## 2014-01-12 NOTE — Assessment & Plan Note (Signed)
Since the last time he was here, he try Lipitor, followup FLP was great however shortly after he had to stop Lipitor due to  aches. Is willing to try a different medication. Plan: Labs, Pravachol?

## 2014-01-12 NOTE — Progress Notes (Signed)
   Subjective:    Patient ID: Dennis Huff, male    DOB: 25-May-1946, 68 y.o.   MRN: [id number]  DOS:  01/12/2014 Type of  visit: Six-month followup High cholesterol, took Lipitor for several weeks, self discontinued it due to to joint aches and stiffness. Pre-diabetes, has not changed his lifestyle     ROS Denies chest pain, difficulty breathing or lower extremity edema No  nausea, vomiting, diarrhea.  Past Medical History  Diagnosis Date  . B12 deficiency     mild   . Hyperlipidemia   . Hypertension   . 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 Normal cardiac stress test 11-2010     done for an abnormal EKG    Past Surgical History  Procedure Laterality Date  . None reported      History   Social History  . Marital Status: Married    Spouse Name: N/A    Number of Children: 2  . Years of Education: N/A   Occupational History  . colostomy tech [employer]   Social History Main Topics  . Smoking status: Never Smoker   . Smokeless tobacco: Never Used  . Alcohol Use: Yes     Comment: rarely  . Drug Use: No  . Sexual Activity: Not on file   Other Topics Concern  . Not on file   Social History Narrative   Occupation: Merchant navy officer for [employer]                            Medication List       This list is accurate as of: 01/12/14 11:59 PM.  Always use your most recent med list.               aspirin 81 MG tablet  Take 81 mg by mouth daily.     multivitamin tablet  Take 1 tablet by mouth daily.           Objective:   Physical Exam BP 133/63  Pulse 81  Temp(Src) 98.2 F (36.8 C) (Oral)  Wt 201 lb 3.2 oz (91.264 kg)  SpO2 95% General -- alert, well-developed, NAD.   Lungs -- normal respiratory effort, no intercostal retractions, no accessory muscle use, and normal breath sounds.  Heart-- normal rate, regular rhythm, no murmur.   Extremities-- no pretibial edema bilaterally    Neurologic--  alert & oriented X3. Speech normal, gait normal, strength normal in all extremities.  Psych-- Cognition and judgment appear intact. Cooperative with normal attention span and concentration. No anxious or depressed appearing.          Assessment & Plan:

## 2014-01-22 ENCOUNTER — Other Ambulatory Visit: Payer: BC Managed Care – PPO

## 2014-01-23 ENCOUNTER — Other Ambulatory Visit (INDEPENDENT_AMBULATORY_CARE_PROVIDER_SITE_OTHER): Payer: BC Managed Care – PPO

## 2014-01-23 DIAGNOSIS — R7309 Other abnormal glucose: Secondary | ICD-10-CM

## 2014-01-23 DIAGNOSIS — E785 Hyperlipidemia, unspecified: Secondary | ICD-10-CM

## 2014-01-23 LAB — LIPID PANEL
Cholesterol: 218 mg/dL — ABNORMAL HIGH (ref 0–200)
HDL: 41.5 mg/dL (ref >39.00)
LDL Cholesterol: 162 mg/dL — ABNORMAL HIGH (ref 0–99)
Total CHOL/HDL Ratio: 5
Triglycerides: 75 mg/dL (ref 0.0–149.0)
VLDL: 15 mg/dL (ref 0.0–40.0)

## 2014-01-23 LAB — HEMOGLOBIN A1C: HEMOGLOBIN A1C: 6.1 % (ref 4.6–6.5)

## 2014-01-23 NOTE — Addendum Note (Signed)
Addended by: Harl Bowie on: 01/23/2014 08:07 AM   Modules accepted: Orders

## 2014-01-25 ENCOUNTER — Encounter: Payer: Self-pay | Admitting: *Deleted

## 2014-01-25 ENCOUNTER — Other Ambulatory Visit: Payer: Self-pay | Admitting: Internal Medicine

## 2014-01-25 ENCOUNTER — Telehealth: Payer: Self-pay | Admitting: *Deleted

## 2014-01-25 DIAGNOSIS — E785 Hyperlipidemia, unspecified: Secondary | ICD-10-CM

## 2014-01-25 MED ORDER — PRAVASTATIN SODIUM 20 MG PO TABS: 20.0000 mg | ORAL_TABLET | Freq: Every day | ORAL | Status: DC

## 2014-01-25 NOTE — Progress Notes (Addendum)
Called pt again land left message to return my call regarding labs.Letter sent to patient with lab results and to advise of changes to medication regime.

## 2014-01-25 NOTE — Addendum Note (Signed)
Addended by: Chilton Greathouse on: 01/25/2014 08:44 AM   Modules accepted: Orders

## 2014-01-25 NOTE — Telephone Encounter (Signed)
Message copied by Chilton Greathouse on Thu Jan 25, 2014  8:06 AM ------      Message from: Kathlene November E      Created: Wed Jan 24, 2014  9:09 AM       Advise pt:      DM test  A1c is great at 6.1      Cholesterol is high again, he developed aches with Lipitor consequently recommend Pravachol 20 mg daily #30 and 3 refills      Arrange for a FLP, AST, ALT in 6 weeks       ------

## 2014-01-25 NOTE — Progress Notes (Signed)
Letter sent with detailed instructions and new script sent to pts pharmacy

## 2014-01-25 NOTE — Telephone Encounter (Signed)
Left message on voicemail to return my call.  

## 2014-01-25 NOTE — Telephone Encounter (Signed)
Left message on voice mail for the patient to return my call regarding labs

## 2014-02-05 NOTE — Telephone Encounter (Signed)
Left detailed message on voice mail concerning labs. Rx for pravachol sent to pts pharmacy, made him aware via vm.Patient also instructed to repeat labs in 6 weeks.

## 2014-03-18 ENCOUNTER — Telehealth: Payer: Self-pay | Admitting: Internal Medicine

## 2014-03-18 NOTE — Telephone Encounter (Signed)
Was prescribed Pravachol for the management of cholesterol, needs a FLP, AST, ALT to see if that medication is working. Please arrange

## 2014-03-19 NOTE — Telephone Encounter (Signed)
lmovm

## 2014-03-26 ENCOUNTER — Other Ambulatory Visit (INDEPENDENT_AMBULATORY_CARE_PROVIDER_SITE_OTHER): Payer: BC Managed Care – PPO

## 2014-03-26 DIAGNOSIS — E785 Hyperlipidemia, unspecified: Secondary | ICD-10-CM

## 2014-03-26 LAB — LIPID PANEL
CHOLESTEROL: 180 mg/dL (ref 0–200)
HDL: 44.1 mg/dL (ref >39.00)
LDL Cholesterol: 117 mg/dL — ABNORMAL HIGH (ref 0–99)
NonHDL: 135.9
Total CHOL/HDL Ratio: 4
Triglycerides: 96 mg/dL (ref 0.0–149.0)
VLDL: 19.2 mg/dL (ref 0.0–40.0)

## 2014-03-26 LAB — ALT: ALT: 23 U/L (ref 0–53)

## 2014-03-26 LAB — AST: AST: 27 U/L (ref 0–37)

## 2014-03-26 NOTE — Telephone Encounter (Signed)
Done

## 2014-03-28 ENCOUNTER — Encounter: Payer: Self-pay | Admitting: *Deleted

## 2014-09-24 ENCOUNTER — Encounter: Payer: Self-pay | Admitting: Internal Medicine

## 2014-09-24 ENCOUNTER — Ambulatory Visit (INDEPENDENT_AMBULATORY_CARE_PROVIDER_SITE_OTHER): Payer: BLUE CROSS/BLUE SHIELD | Admitting: Internal Medicine

## 2014-09-24 VITALS — BP 167/81 | HR 64 | Temp 98.1°F | Ht 72.0 in | Wt 195.4 lb

## 2014-09-24 DIAGNOSIS — E785 Hyperlipidemia, unspecified: Secondary | ICD-10-CM

## 2014-09-24 DIAGNOSIS — R7303 Prediabetes: Secondary | ICD-10-CM

## 2014-09-24 DIAGNOSIS — I10 Essential (primary) hypertension: Secondary | ICD-10-CM

## 2014-09-24 DIAGNOSIS — Z0000 Encounter for general adult medical examination without abnormal findings: Secondary | ICD-10-CM

## 2014-09-24 NOTE — Progress Notes (Signed)
Pre visit review using our clinic review tool, if applicable. No additional management support is needed unless otherwise documented below in the visit note. 

## 2014-09-24 NOTE — Patient Instructions (Signed)
Stop by the front desk and schedule labs to be done within few days (fasting)  Check the  blood pressure 2 or 3 times a month  Be sure your blood pressure is between 110/65 and  145/85.  if it is consistently higher or lower, let me know    As long as your blood pressure is good i don't need to see you again but  in one year for a physical exam, fasting. Please make an appointment.

## 2014-09-24 NOTE — Assessment & Plan Note (Addendum)
BP slightly elevated upon arrival, recheck andwas 145/75, recommend ambulatory BPs

## 2014-09-24 NOTE — Progress Notes (Signed)
Subjective:    Patient ID: Dennis Huff, male    DOB: 1946/05/04, 69 y.o.   MRN: [id number]  DOS:  09/24/2014 Type of visit - description : cpx Interval history:  In general feeling well, had a respiratory infection several days ago, now is better,  Still has minimal cough.  ROS Denies chest pain or difficulty breathing No nausea, vomiting, diarrhea or blood in the stools Occ  heartburn if he eats late , no dysphagia No dysuria, gross hematuria difficulty urinating. Urinary flow is   slightly slow  Past Medical History  Diagnosis Date  . B12 deficiency     mild   . Hyperlipidemia   . Hypertension   . 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 Normal cardiac stress test 11-2010     done for an abnormal EKG    Past Surgical History  Procedure Laterality Date  . None reported      History   Social History  . Marital Status: Married    Spouse Name: N/A    Number of Children: 2  . Years of Education: N/A   Occupational History  . colostomy tech [employer]   Social History Main Topics  . Smoking status: Never Smoker   . Smokeless tobacco: Never Used  . Alcohol Use: Yes     Comment: rarely  . Drug Use: No  . Sexual Activity: Not on file   Other Topics Concern  . Not on file   Social History Narrative   Occupation: Merchant navy officer for colostomy products , to retire soon   2 adult and independent children                        Family History  Problem Relation Age of Onset  . Diabetes Mother   . Coronary artery disease Other     GF---> MI at around 67 y/o  . Colon cancer Neg Hx   . Prostate cancer Neg Hx   . Stroke Neg Hx       Medication List       This list is accurate as of: 09/24/14 11:59 PM.  Always use your most recent med list.               aspirin 81 MG tablet  Take 81 mg by mouth daily.     multivitamin tablet  Take 1 tablet by mouth daily.           Objective:   Physical  Exam BP 167/81 mmHg  Pulse 64  Temp(Src) 98.1 F (36.7 C) (Oral)  Ht 6' (1.829 m)  Wt 195 lb 6 oz (88.622 kg)  BMI 26.49 kg/m2  SpO2 100% General -- alert, well-developed, NAD.  Neck --no thyromegaly , normal carotid pulse  HEENT-- Not pale.   Lungs -- normal respiratory effort, no intercostal retractions, no accessory muscle use, and normal breath sounds.  Heart-- normal rate, regular rhythm, no murmur.  Abdomen-- Not distended, good bowel sounds,soft, non-tender. Rectal-- No external abnormalities noted. Normal sphincter tone. No rectal masses or tenderness. Stool brown  Prostate--Prostate gland firm and smooth, no enlargement, nodularity, tenderness, mass, asymmetry or induration. Extremities-- no pretibial edema bilaterally  Neurologic--  alert & oriented X3. Speech normal, gait appropriate for age, strength symmetric and appropriate for age.  Psych-- Cognition and judgment appear intact. Cooperative with normal attention span and concentration. No anxious  or depressed appearing.      Assessment & Plan:

## 2014-09-24 NOTE — Assessment & Plan Note (Addendum)
Td 2008 Had a pneumonia  prevnar 01-2014 Had a Shingles shot 2012 Had a Flu shot  at work  s/p several Cscopes per pt , last Cscope 04-2007 normal (Dr Johna Roles), next -----10 years  labs  discussed diet-exercise

## 2014-09-24 NOTE — Assessment & Plan Note (Signed)
Patient was intolerant to Lipitor and now to Pravachol due to aches. FLP, consider a different statin along with CoQ10. LDL goal 100

## 2014-09-27 ENCOUNTER — Other Ambulatory Visit (INDEPENDENT_AMBULATORY_CARE_PROVIDER_SITE_OTHER): Payer: BLUE CROSS/BLUE SHIELD

## 2014-09-27 DIAGNOSIS — R7303 Prediabetes: Secondary | ICD-10-CM

## 2014-09-27 DIAGNOSIS — R7309 Other abnormal glucose: Secondary | ICD-10-CM

## 2014-09-27 DIAGNOSIS — Z0000 Encounter for general adult medical examination without abnormal findings: Secondary | ICD-10-CM

## 2014-09-27 DIAGNOSIS — D649 Anemia, unspecified: Secondary | ICD-10-CM

## 2014-09-27 DIAGNOSIS — E785 Hyperlipidemia, unspecified: Secondary | ICD-10-CM

## 2014-09-27 LAB — PSA: PSA: 2.28 ng/mL (ref 0.10–4.00)

## 2014-09-27 LAB — BASIC METABOLIC PANEL
BUN: 18 mg/dL (ref 6–23)
CO2: 28 mEq/L (ref 19–32)
Calcium: 9.9 mg/dL (ref 8.4–10.5)
Chloride: 109 mEq/L (ref 96–112)
Creatinine, Ser: 1.15 mg/dL (ref 0.40–1.50)
GFR: 81.38 mL/min (ref >60.00)
Glucose, Bld: 95 mg/dL (ref 70–99)
POTASSIUM: 4.4 meq/L (ref 3.5–5.1)
SODIUM: 146 meq/L — AB (ref 135–145)

## 2014-09-27 LAB — LIPID PANEL
CHOLESTEROL: 243 mg/dL — AB (ref 0–200)
HDL: 51.8 mg/dL (ref >39.00)
LDL Cholesterol: 162 mg/dL — ABNORMAL HIGH (ref 0–99)
NonHDL: 191.2
TRIGLYCERIDES: 147 mg/dL (ref 0.0–149.0)
Total CHOL/HDL Ratio: 5
VLDL: 29.4 mg/dL (ref 0.0–40.0)

## 2014-09-27 LAB — CBC WITH DIFFERENTIAL/PLATELET
Basophils Absolute: 0 10*3/uL (ref 0.0–0.1)
Basophils Relative: 0.4 % (ref 0.0–3.0)
EOS ABS: 0.1 10*3/uL (ref 0.0–0.7)
Eosinophils Relative: 5.8 % — ABNORMAL HIGH (ref 0.0–5.0)
HEMATOCRIT: 34.5 % — AB (ref 39.0–52.0)
HEMOGLOBIN: 11.5 g/dL — AB (ref 13.0–17.0)
Lymphocytes Relative: 27.8 % (ref 12.0–46.0)
Lymphs Abs: 0.7 10*3/uL (ref 0.7–4.0)
MCHC: 33.3 g/dL (ref 30.0–36.0)
MCV: 83.9 fl (ref 78.0–100.0)
Monocytes Absolute: 0.1 10*3/uL (ref 0.1–1.0)
Monocytes Relative: 5.5 % (ref 3.0–12.0)
NEUTROS ABS: 1.5 10*3/uL (ref 1.4–7.7)
Neutrophils Relative %: 60.5 % (ref 43.0–77.0)
Platelets: 248 10*3/uL (ref 150.0–400.0)
RBC: 4.11 Mil/uL — AB (ref 4.22–5.81)
RDW: 13.5 % (ref 11.5–15.5)
WBC: 2.5 10*3/uL — AB (ref 4.0–10.5)

## 2014-09-27 LAB — HEMOGLOBIN A1C: Hgb A1c MFr Bld: 6.1 % (ref 4.6–6.5)

## 2014-09-27 LAB — ALT: ALT: 22 U/L (ref 0–53)

## 2014-09-27 LAB — AST: AST: 25 U/L (ref 0–37)

## 2014-10-08 MED ORDER — ROSUVASTATIN CALCIUM 5 MG PO TABS: 5.0000 mg | ORAL_TABLET | Freq: Every day | ORAL | Status: DC

## 2014-10-08 NOTE — Addendum Note (Signed)
Addended by: Janalee Dane C on: 10/08/2014 02:00 PM   Modules accepted: Orders

## 2014-11-26 ENCOUNTER — Telehealth: Payer: Self-pay

## 2014-11-26 NOTE — Telephone Encounter (Signed)
-----   Message from Colon Branch, MD sent at 11/25/2014 11:22 AM EDT ----- Regarding: send a letter  Send a letter Dennis Huff, We  Tried  to contact you few weeks ago regards your last blood work. We recommended you to start taking a medication called Crestor. I was also concerned about anemia (low blood count). Please call the office   and schedule the following labs: CBC, iron and ferritin --- dx anemia FLP, AST ALT --- dx high cholesterol

## 2014-11-26 NOTE — Telephone Encounter (Signed)
Letter printed and mailed to Pt.  

## 2014-12-06 ENCOUNTER — Other Ambulatory Visit (INDEPENDENT_AMBULATORY_CARE_PROVIDER_SITE_OTHER): Payer: BLUE CROSS/BLUE SHIELD

## 2014-12-06 DIAGNOSIS — E785 Hyperlipidemia, unspecified: Secondary | ICD-10-CM | POA: Diagnosis not present

## 2014-12-06 DIAGNOSIS — D649 Anemia, unspecified: Secondary | ICD-10-CM

## 2014-12-06 LAB — CBC WITH DIFFERENTIAL/PLATELET
BASOS PCT: 0.6 % (ref 0.0–3.0)
Basophils Absolute: 0 10*3/uL (ref 0.0–0.1)
EOS ABS: 0.1 10*3/uL (ref 0.0–0.7)
EOS PCT: 2.7 % (ref 0.0–5.0)
HCT: 40.2 % (ref 39.0–52.0)
HEMOGLOBIN: 13.6 g/dL (ref 13.0–17.0)
LYMPHS ABS: 0.9 10*3/uL (ref 0.7–4.0)
Lymphocytes Relative: 25.2 % (ref 12.0–46.0)
MCHC: 33.8 g/dL (ref 30.0–36.0)
MCV: 83.9 fl (ref 78.0–100.0)
Monocytes Absolute: 0.3 10*3/uL (ref 0.1–1.0)
Monocytes Relative: 9.3 % (ref 3.0–12.0)
NEUTROS ABS: 2.2 10*3/uL (ref 1.4–7.7)
NEUTROS PCT: 62.2 % (ref 43.0–77.0)
Platelets: 239 10*3/uL (ref 150.0–400.0)
RBC: 4.79 Mil/uL (ref 4.22–5.81)
RDW: 13.4 % (ref 11.5–15.5)
WBC: 3.5 10*3/uL — AB (ref 4.0–10.5)

## 2014-12-06 LAB — LIPID PANEL
Cholesterol: 147 mg/dL (ref 0–200)
HDL: 52.9 mg/dL (ref >39.00)
LDL CALC: 79 mg/dL (ref 0–99)
NonHDL: 94.1
TRIGLYCERIDES: 78 mg/dL (ref 0.0–149.0)
Total CHOL/HDL Ratio: 3
VLDL: 15.6 mg/dL (ref 0.0–40.0)

## 2014-12-06 LAB — IRON: Iron: 87 ug/dL (ref 42–165)

## 2014-12-06 LAB — AST: AST: 42 U/L — AB (ref 0–37)

## 2014-12-06 LAB — FERRITIN: Ferritin: 107.4 ng/mL (ref 22.0–322.0)

## 2014-12-06 LAB — ALT: ALT: 44 U/L (ref 0–53)

## 2015-08-04 IMAGING — US US ART/VEN ABD/PELV/SCROTUM DOPPLER LTD
1 series · 14 of 25 positions shown · non-contrast
Comparison: None.

CLINICAL DATA: Left scrotal mass on physical examination.

EXAM:
SCROTAL ULTRASOUND
DOPPLER ULTRASOUND OF THE TESTICLES
TECHNIQUE: Complete ultrasound examination of the testicles, epididymis, and
other scrotal structures was performed. Color and spectral Doppler
ultrasound were also utilized to evaluate blood flow to the
testicles.

[Series 1: us art/ven abd/pelv/scrotum doppler ltd · 0.08mm/px · 14 of 49 slices shown]
[im 1/49]
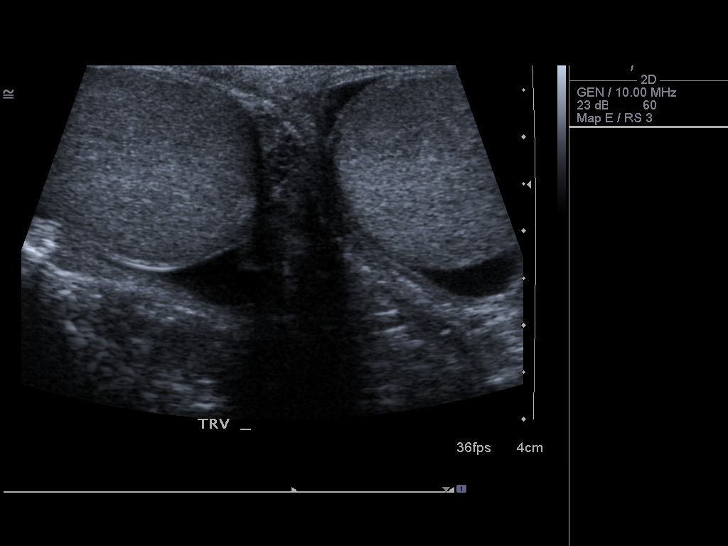
[im 5/49]
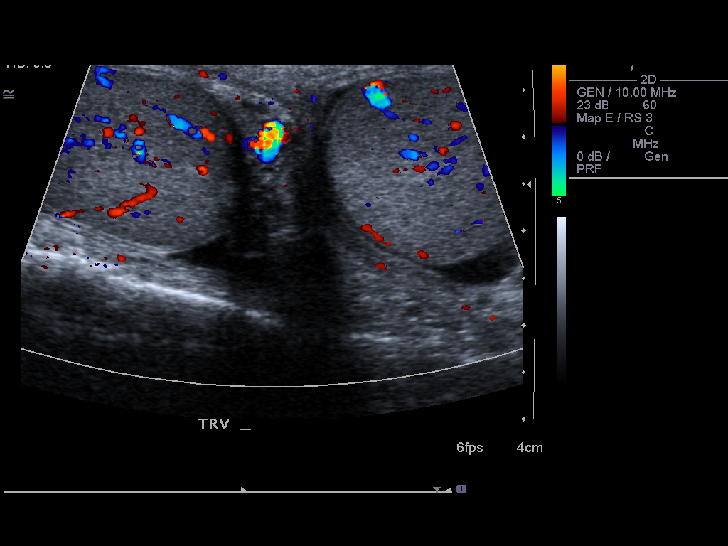
[im 9/49]
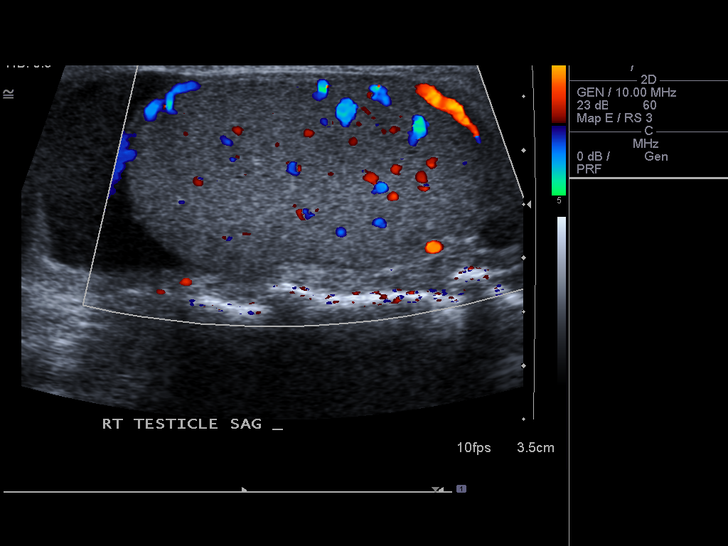
[im 13/49]
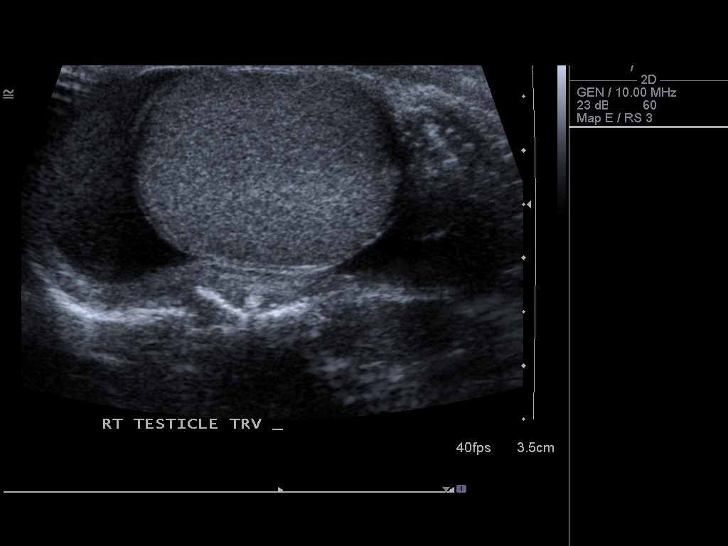
[im 17/49]
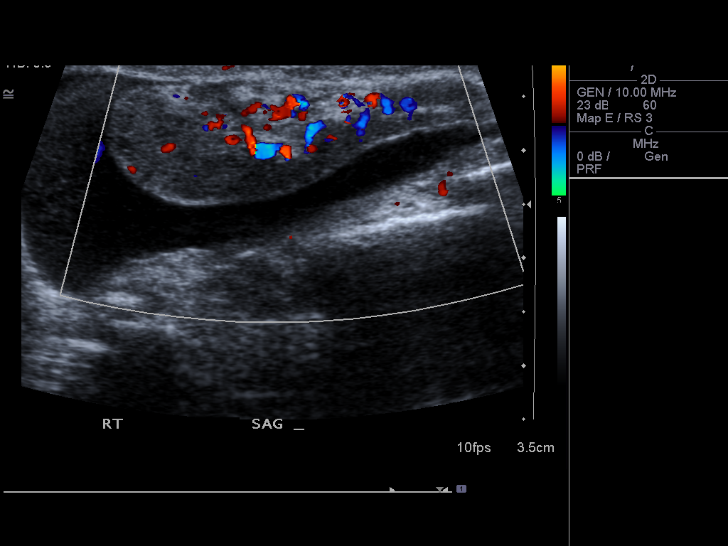
[im 19/49]
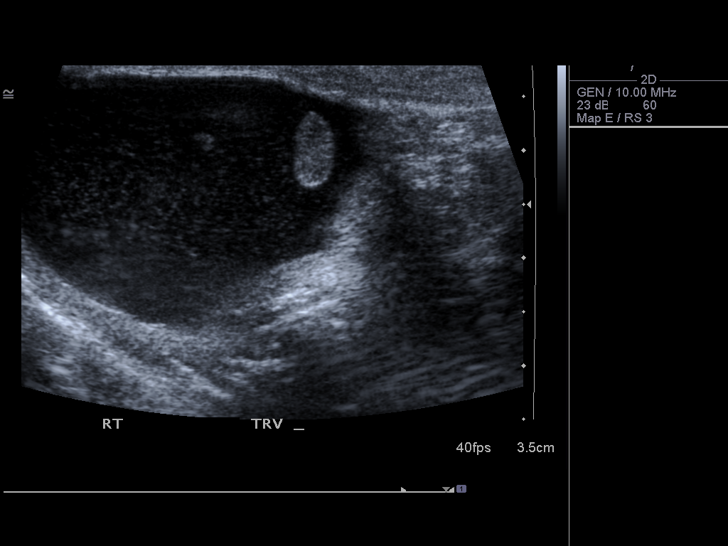
[im 23/49]
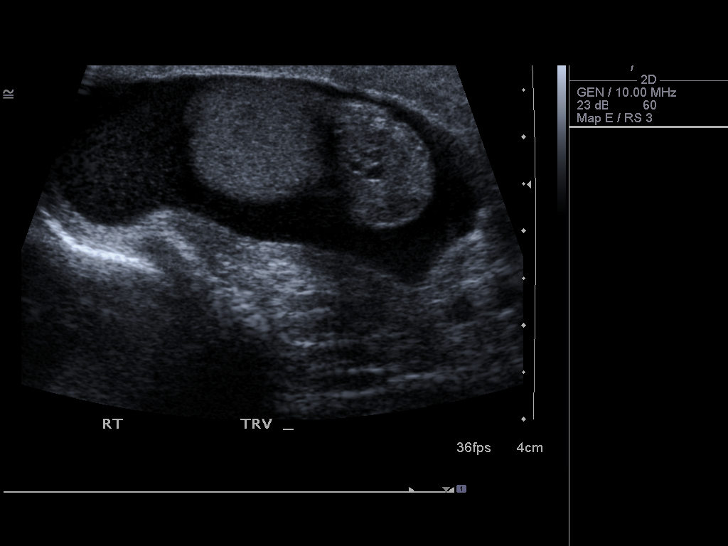
[im 27/49]
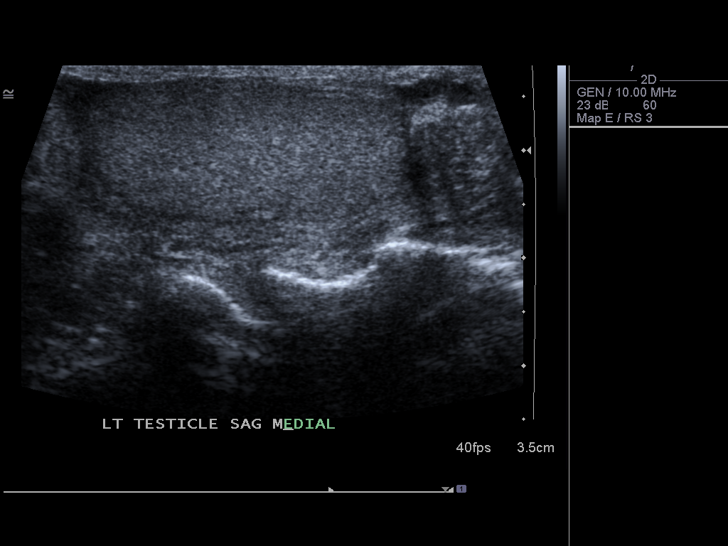
[im 31/49]
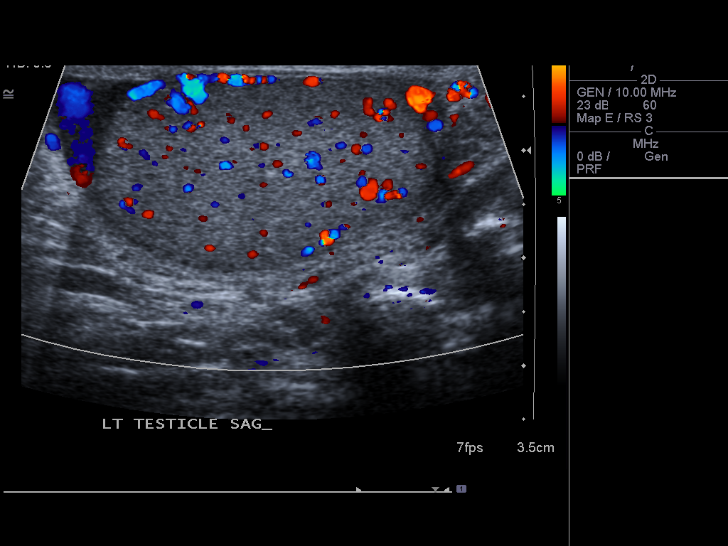
[im 33/49]
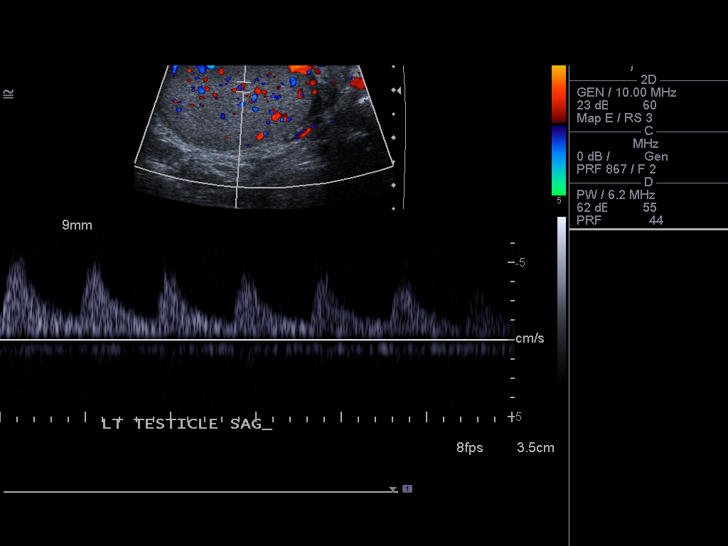
[im 37/49]
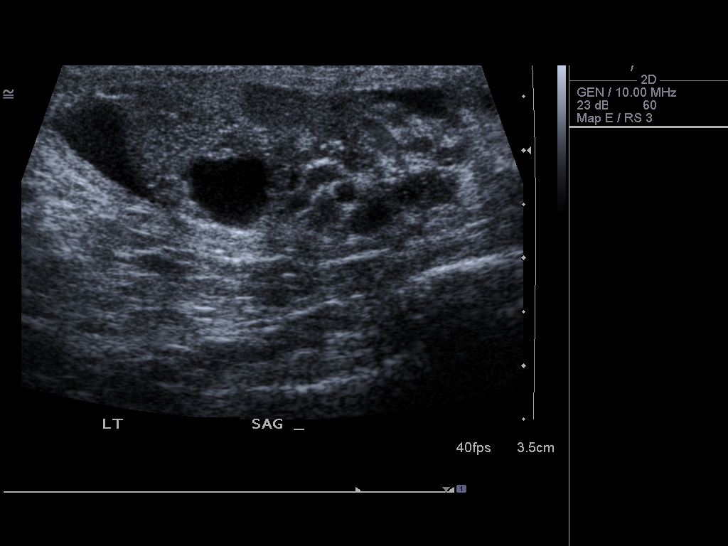
[im 41/49]
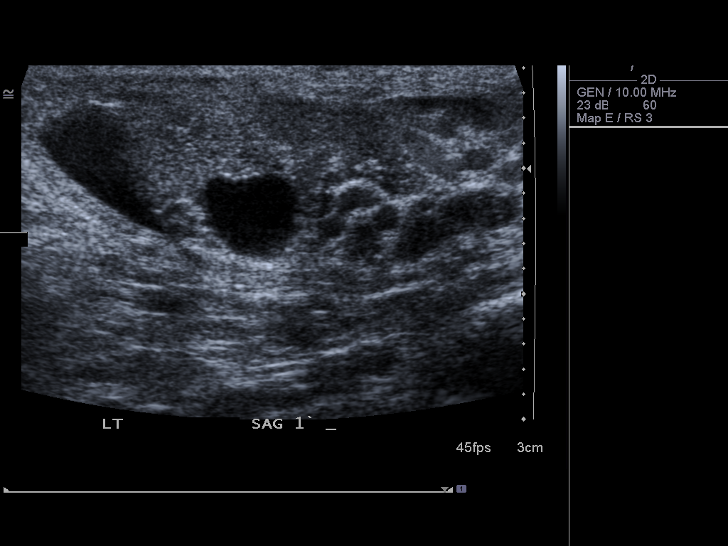
[im 45/49]
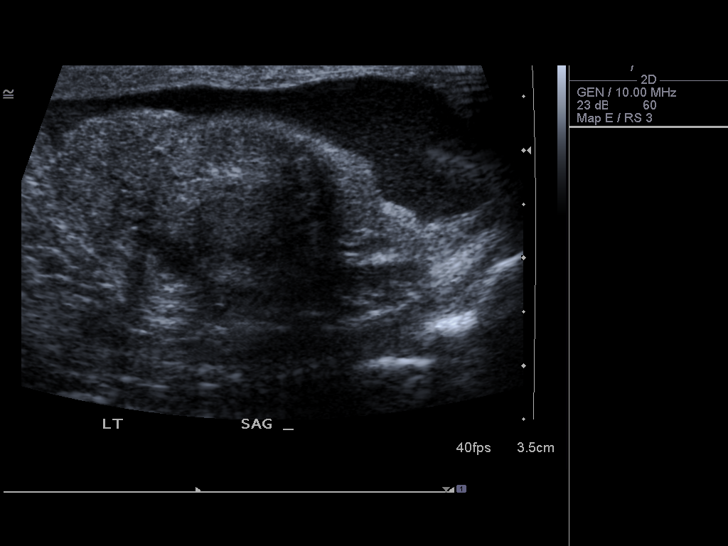
[im 49/49]
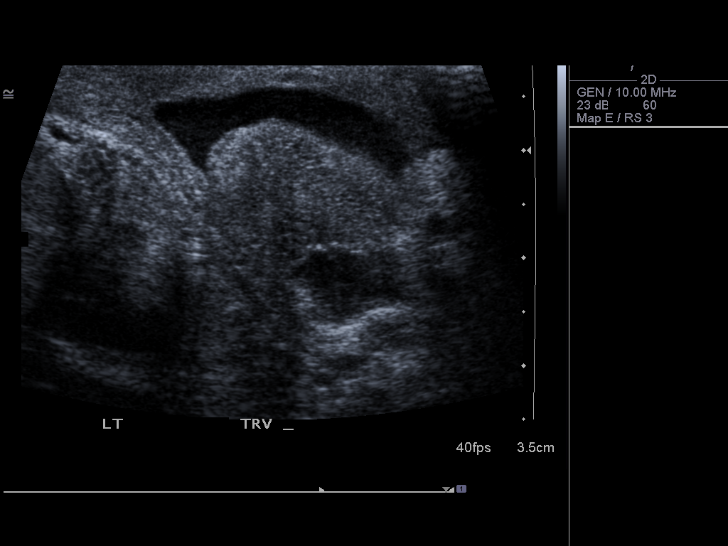

[14 of 25 positions shown; findings below may reference images not displayed]

FINDINGS: Right testis:  Normal.

Left testis:  Normal.

Right epididymis:  Diffusely heterogeneous.

Left epididymis:  Diffusely heterogeneous.  9 mm cyst in the body.

Hydrocele: Moderate size bilateral hydroceles containing diffuse
internal echoes.

Varicocele:  Nonvisualized.

Pulsed Doppler interrogation of both testes demonstrates low
resistance arterial and venous waveforms bilaterally.
IMPRESSION: 1. Moderate size bilateral hydroceles containing diffuse internal
echoes.

2. Bilateral heterogeneity of the epidydimi as well as a 9 mm left
epididymal cyst.

## 2016-03-09 ENCOUNTER — Encounter: Payer: BLUE CROSS/BLUE SHIELD | Admitting: Internal Medicine

## 2016-03-20 ENCOUNTER — Ambulatory Visit (INDEPENDENT_AMBULATORY_CARE_PROVIDER_SITE_OTHER): Payer: BLUE CROSS/BLUE SHIELD | Admitting: Internal Medicine

## 2016-03-20 ENCOUNTER — Encounter: Payer: Self-pay | Admitting: Internal Medicine

## 2016-03-20 VITALS — BP 128/82 | HR 63 | Temp 98.1°F | Ht 73.0 in | Wt 201.4 lb

## 2016-03-20 DIAGNOSIS — R739 Hyperglycemia, unspecified: Secondary | ICD-10-CM

## 2016-03-20 DIAGNOSIS — E538 Deficiency of other specified B group vitamins: Secondary | ICD-10-CM | POA: Diagnosis not present

## 2016-03-20 DIAGNOSIS — Z0000 Encounter for general adult medical examination without abnormal findings: Secondary | ICD-10-CM | POA: Diagnosis not present

## 2016-03-20 DIAGNOSIS — E785 Hyperlipidemia, unspecified: Secondary | ICD-10-CM

## 2016-03-20 DIAGNOSIS — Z09 Encounter for follow-up examination after completed treatment for conditions other than malignant neoplasm: Secondary | ICD-10-CM | POA: Insufficient documentation

## 2016-03-20 LAB — ALT: ALT: 21 U/L (ref 0–53)

## 2016-03-20 LAB — BASIC METABOLIC PANEL
BUN: 14 mg/dL (ref 6–23)
CHLORIDE: 107 meq/L (ref 96–112)
CO2: 29 meq/L (ref 19–32)
Calcium: 9.7 mg/dL (ref 8.4–10.5)
Creatinine, Ser: 1.08 mg/dL (ref 0.40–1.50)
GFR: 87.12 mL/min (ref >60.00)
GLUCOSE: 88 mg/dL (ref 70–99)
POTASSIUM: 4.3 meq/L (ref 3.5–5.1)
SODIUM: 143 meq/L (ref 135–145)

## 2016-03-20 LAB — LIPID PANEL
CHOLESTEROL: 243 mg/dL — AB (ref 0–200)
HDL: 44.8 mg/dL (ref >39.00)
LDL CALC: 180 mg/dL — AB (ref 0–99)
NonHDL: 198.47
TRIGLYCERIDES: 94 mg/dL (ref 0.0–149.0)
Total CHOL/HDL Ratio: 5
VLDL: 18.8 mg/dL (ref 0.0–40.0)

## 2016-03-20 LAB — CBC WITH DIFFERENTIAL/PLATELET
BASOS PCT: 0.7 % (ref 0.0–3.0)
Basophils Absolute: 0 10*3/uL (ref 0.0–0.1)
EOS ABS: 0.1 10*3/uL (ref 0.0–0.7)
EOS PCT: 2.6 % (ref 0.0–5.0)
HEMATOCRIT: 41.4 % (ref 39.0–52.0)
Hemoglobin: 13.7 g/dL (ref 13.0–17.0)
Lymphocytes Relative: 32.1 % (ref 12.0–46.0)
Lymphs Abs: 1.1 10*3/uL (ref 0.7–4.0)
MCHC: 33.1 g/dL (ref 30.0–36.0)
MCV: 84.6 fl (ref 78.0–100.0)
MONO ABS: 0.3 10*3/uL (ref 0.1–1.0)
Monocytes Relative: 9.1 % (ref 3.0–12.0)
NEUTROS ABS: 1.9 10*3/uL (ref 1.4–7.7)
Neutrophils Relative %: 55.5 % (ref 43.0–77.0)
PLATELETS: 225 10*3/uL (ref 150.0–400.0)
RBC: 4.9 Mil/uL (ref 4.22–5.81)
RDW: 14.1 % (ref 11.5–15.5)
WBC: 3.4 10*3/uL — ABNORMAL LOW (ref 4.0–10.5)

## 2016-03-20 LAB — VITAMIN B12: VITAMIN B 12: 240 pg/mL (ref 211–911)

## 2016-03-20 LAB — HEMOGLOBIN A1C: HEMOGLOBIN A1C: 5.9 % (ref 4.6–6.5)

## 2016-03-20 LAB — AST: AST: 21 U/L (ref 0–37)

## 2016-03-20 MED ORDER — MELOXICAM 15 MG PO TABS
15.0000 mg | ORAL_TABLET | Freq: Every day | ORAL | Status: DC
Start: 2016-03-20 — End: 2016-04-12

## 2016-03-20 NOTE — Progress Notes (Signed)
Subjective:    Patient ID: Dennis Huff, male    DOB: [birth year], 70 y.o.   MRN: [id number]  DOS:  03/20/2016 Type of visit - description :  Complete physical exam Interval history: Decided not to take Crestor, no side effects, he simply quit.   Review of Systems Constitutional: No fever. No chills. No unexplained wt changes. No unusual sweats  HEENT: No dental problems, no ear discharge, no facial swelling, no voice changes. No eye discharge, no eye  redness , no  intolerance to light   Respiratory: No wheezing , no  difficulty breathing. No cough , no mucus production  Cardiovascular: No CP, no leg swelling , no  Palpitations  GI: no nausea, no vomiting, no diarrhea , no  abdominal pain.  No blood in the stools. No dysphagia, no odynophagia    Endocrine: No polyphagia, no polyuria , no polydipsia  GU: No dysuria, gross hematuria, difficulty urinating. No urinary urgency, no frequency.  Musculoskeletal: On and off right shoulder pain for years, more consistent in the last 6 months. No neck pain.  Skin: No change in the color of the skin, palor , no  Rash  Allergic, immunologic: No environmental allergies , no  food allergies  Neurological: No dizziness no  syncope. No headaches. No diplopia, no slurred, no slurred speech, no motor deficits, no facial  Numbness  Hematological: No enlarged lymph nodes, no easy bruising , no unusual bleedings  Psychiatry: No suicidal ideas, no hallucinations, no beavior problems, no confusion.  No unusual/severe anxiety, no depression    Past Medical History  Diagnosis Date  . B12 deficiency     mild   . Hyperlipidemia   . Hypertension   . 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 Normal cardiac stress test 11-2010     done for an abnormal EKG    Past Surgical History  Procedure Laterality Date  . No past surgeries      Social History   Social History  . Marital Status:  Married    Spouse Name: N/A  . Number of Children: 2  . Years of Education: N/A   Occupational History  . retired -- Chief Executive Officer   Social History Main Topics  . Smoking status: Never Smoker   . Smokeless tobacco: Never Used  . Alcohol Use: Yes     Comment: rarely  . Drug Use: No  . Sexual Activity: Not on file   Other Topics Concern  . Not on file   Social History Narrative   Lives w/ wife, mother in law lives w/ her    2 adult and independent children                        Family History  Problem Relation Age of Onset  . Diabetes Mother   . Coronary artery disease Other     GF---> MI at around 69 y/o  . Colon cancer Neg Hx   . Prostate cancer Neg Hx   . Stroke Neg Hx        Medication List       This list is accurate as of: 03/20/16  3:52 PM.  Always use your most recent med list.               aspirin 81 MG tablet  Take 81 mg by mouth daily.  meloxicam 15 MG tablet  Commonly known as:  MOBIC  Take 1 tablet (15 mg total) by mouth daily.     multivitamin tablet  Take 1 tablet by mouth daily.           Objective:   Physical Exam  Musculoskeletal:       Arms:  BP 128/82 mmHg  Pulse 63  Temp(Src) 98.1 F (36.7 C) (Oral)  Ht 6\' 1"  (1.854 m)  Wt 201 lb 6 oz (91.343 kg)  BMI 26.57 kg/m2  SpO2 96%  General:   Well developed, well nourished . NAD.  Neck: No  thyromegaly  HEENT:  Normocephalic . Face symmetric, atraumatic Lungs:  CTA B Normal respiratory effort, no intercostal retractions, no accessory muscle use. Heart: RRR,  no murmur.  No pretibial edema bilaterally  Abdomen:  Not distended, soft, non-tender. No rebound or rigidity.   Skin: Exposed areas without rash. Not pale. Not jaundice MSK: Shoulders without deformity, range of motion normal, no TTP at the   [facility] joint. Neck is full range of motion, no TTP Neurologic:  alert & oriented X3.  Speech normal, gait appropriate for age and unassisted Strength  symmetric and appropriate for age.  Psych: Cognition and judgment appear intact.  Cooperative with normal attention span and concentration.  Behavior appropriate. No anxious or depressed appearing.    Assessment & Plan:   Assessment Hyperglycemia, A1c 5.9  2010 HTN ? On no meds Hyperlipidemia: Intolerant to Lipitor, Pravachol Mild B12 deficiency-- oral supplements  Abnormal EKG, abnormal stress test 11-2010 Cytopenia, hematology evaluation 2011, Rx observation  Plan: Hyperglycemia: Check A1c, continue healthy lifestyle Hyperlipidemia:  Crestor caused no s/e but he quit, check labs, he is willing to retry Crestor. B12 deficiency, check labs. Shoulder pain: Exam is essentially normal. Trial with meloxicam daily for 2 weeks, GI precautions discussed, if not better will call for sports medicine referral RTC one year

## 2016-03-20 NOTE — Assessment & Plan Note (Addendum)
Td 2008; s/p pneumonia shot; prevnar 01-2014; Had a Shingles shot 2012   s/p several Cscopes per pt , last Cscope 04-2007 normal (Dr Johna Roles), next -----10 years  PSA stable over time, reassess next year  discussed diet-exercise

## 2016-03-20 NOTE — Progress Notes (Signed)
Pre visit review using our clinic review tool, if applicable. No additional management support is needed unless otherwise documented below in the visit note. 

## 2016-03-20 NOTE — Patient Instructions (Signed)
GO TO THE LAB : Get the blood work     GO TO THE FRONT DESK Schedule your next appointment for a  Physical exam in 1 year    For her shoulder pain  Take meloxicam 15 mg 1 tablet a day x 2 weeks  Always take it with food because may cause gastritis and ulcers.  If you notice nausea, stomach pain, change in the color of stools --->  Stop the medicine and let us know  If you are not better after 2 weeks- call for a referral

## 2016-03-20 NOTE — Assessment & Plan Note (Signed)
Hyperglycemia: Check A1c, continue healthy lifestyle Hyperlipidemia:  Crestor caused no s/e but he quit, check labs, he is willing to retry Crestor. B12 deficiency, check labs. Shoulder pain: Exam is essentially normal. Trial with meloxicam daily for 2 weeks, GI precautions discussed, if not better will call for sports medicine referral RTC one year

## 2016-03-23 MED ORDER — ROSUVASTATIN CALCIUM 5 MG PO TABS: 5.0000 mg | ORAL_TABLET | Freq: Every day | ORAL | Status: DC

## 2016-03-23 NOTE — Addendum Note (Signed)
Addended byDamita Dunnings D on: 03/23/2016 09:10 AM   Modules accepted: Orders, SmartSet

## 2016-04-12 ENCOUNTER — Other Ambulatory Visit: Payer: Self-pay | Admitting: Internal Medicine

## 2016-04-13 NOTE — Telephone Encounter (Signed)
Pt is requesting refill on Meloxicam.   Last OV: 03/20/2016 Last Fill: 03/20/2016 #15 and 0RF   Please advise.

## 2016-04-13 NOTE — Telephone Encounter (Signed)
Rx sent 

## 2016-04-13 NOTE — Telephone Encounter (Signed)
Okay #21, to take as needed, if not better let me know for referral

## 2016-05-02 ENCOUNTER — Other Ambulatory Visit: Payer: Self-pay | Admitting: Internal Medicine

## 2016-05-26 ENCOUNTER — Other Ambulatory Visit (INDEPENDENT_AMBULATORY_CARE_PROVIDER_SITE_OTHER): Payer: BLUE CROSS/BLUE SHIELD

## 2016-05-26 DIAGNOSIS — E785 Hyperlipidemia, unspecified: Secondary | ICD-10-CM | POA: Diagnosis not present

## 2016-05-26 LAB — AST: AST: 28 U/L (ref 0–37)

## 2016-05-26 LAB — LIPID PANEL
CHOLESTEROL: 156 mg/dL (ref 0–200)
HDL: 41.9 mg/dL (ref >39.00)
LDL Cholesterol: 97 mg/dL (ref 0–99)
NonHDL: 113.76
Total CHOL/HDL Ratio: 4
Triglycerides: 85 mg/dL (ref 0.0–149.0)
VLDL: 17 mg/dL (ref 0.0–40.0)

## 2016-05-26 LAB — ALT: ALT: 37 U/L (ref 0–53)

## 2016-05-26 MED ORDER — ROSUVASTATIN CALCIUM 5 MG PO TABS: 5.0000 mg | ORAL_TABLET | Freq: Every day | ORAL | 3 refills | Status: DC

## 2017-02-25 ENCOUNTER — Encounter: Payer: Self-pay | Admitting: Gastroenterology

## 2017-03-22 ENCOUNTER — Ambulatory Visit (INDEPENDENT_AMBULATORY_CARE_PROVIDER_SITE_OTHER): Payer: Medicare Other | Admitting: Internal Medicine

## 2017-03-22 ENCOUNTER — Telehealth: Payer: Self-pay | Admitting: Internal Medicine

## 2017-03-22 ENCOUNTER — Encounter: Payer: Self-pay | Admitting: Internal Medicine

## 2017-03-22 VITALS — BP 126/78 | HR 78 | Temp 98.0°F | Resp 14 | Ht 73.0 in | Wt 204.2 lb

## 2017-03-22 DIAGNOSIS — Z1159 Encounter for screening for other viral diseases: Secondary | ICD-10-CM | POA: Diagnosis not present

## 2017-03-22 DIAGNOSIS — R972 Elevated prostate specific antigen [PSA]: Secondary | ICD-10-CM

## 2017-03-22 DIAGNOSIS — Z0000 Encounter for general adult medical examination without abnormal findings: Secondary | ICD-10-CM

## 2017-03-22 DIAGNOSIS — Z23 Encounter for immunization: Secondary | ICD-10-CM

## 2017-03-22 DIAGNOSIS — E785 Hyperlipidemia, unspecified: Secondary | ICD-10-CM

## 2017-03-22 DIAGNOSIS — I10 Essential (primary) hypertension: Secondary | ICD-10-CM | POA: Diagnosis not present

## 2017-03-22 DIAGNOSIS — R3989 Other symptoms and signs involving the genitourinary system: Secondary | ICD-10-CM | POA: Diagnosis not present

## 2017-03-22 DIAGNOSIS — R739 Hyperglycemia, unspecified: Secondary | ICD-10-CM | POA: Diagnosis not present

## 2017-03-22 LAB — LIPID PANEL
CHOLESTEROL: 170 mg/dL (ref 0–200)
HDL: 53.3 mg/dL (ref >39.00)
LDL Cholesterol: 104 mg/dL — ABNORMAL HIGH (ref 0–99)
NonHDL: 117.09
Total CHOL/HDL Ratio: 3
Triglycerides: 64 mg/dL (ref 0.0–149.0)
VLDL: 12.8 mg/dL (ref 0.0–40.0)

## 2017-03-22 LAB — COMPREHENSIVE METABOLIC PANEL
ALBUMIN: 4.2 g/dL (ref 3.5–5.2)
ALK PHOS: 82 U/L (ref 39–117)
ALT: 24 U/L (ref 0–53)
AST: 21 U/L (ref 0–37)
BILIRUBIN TOTAL: 0.8 mg/dL (ref 0.2–1.2)
BUN: 15 mg/dL (ref 6–23)
CO2: 25 mEq/L (ref 19–32)
CREATININE: 1.02 mg/dL (ref 0.40–1.50)
Calcium: 9.9 mg/dL (ref 8.4–10.5)
Chloride: 106 mEq/L (ref 96–112)
GFR: 92.78 mL/min (ref >60.00)
Glucose, Bld: 101 mg/dL — ABNORMAL HIGH (ref 70–99)
Potassium: 4.3 mEq/L (ref 3.5–5.1)
Sodium: 141 mEq/L (ref 135–145)
TOTAL PROTEIN: 6.7 g/dL (ref 6.0–8.3)

## 2017-03-22 LAB — TSH: TSH: 1.55 u[IU]/mL (ref 0.35–4.50)

## 2017-03-22 LAB — PSA: PSA: 4.58 ng/mL — ABNORMAL HIGH (ref 0.10–4.00)

## 2017-03-22 LAB — HEMOGLOBIN A1C: HEMOGLOBIN A1C: 6 % (ref 4.6–6.5)

## 2017-03-22 MED ORDER — ZOSTER VAC RECOMB ADJUVANTED 50 MCG/0.5ML IM SUSR: 0.5000 mL | Freq: Once | INTRAMUSCULAR | 1 refills | Status: AC

## 2017-03-22 NOTE — Patient Instructions (Addendum)
GO TO THE LAB : Get the blood work     GO TO THE FRONT DESK Schedule your next appointment for a  routine checkup in 1 year   Please see one of our nurses for a Medicare wellness

## 2017-03-22 NOTE — Assessment & Plan Note (Addendum)
-  Td 03/22/2017; s/p pneumonia shot; prevnar 01-2014; zostavax 2012; shingrex rx printed  -CCS: s/p several Cscopes per pt , last Cscope 04-2007 normal (Dr Johna Roles), due for a cscope, already got a letter  - Prostate ca screening : DRE normal today except for mild prostate enlargement. Check a PSA -Diet and exercise discusse -Labs: CMP, FLP, A1c, TSH, PSA, hep C

## 2017-03-22 NOTE — Progress Notes (Signed)
Pre visit review using our clinic review tool, if applicable. No additional management support is needed unless otherwise documented below in the visit note. 

## 2017-03-22 NOTE — Assessment & Plan Note (Signed)
Hyperglycemia: Check A1c and TSH. HTN? On no meds, BP normal Hyperlipidemia: On Crestor, check a CMP and FLP B12 deficiency: On oral supplements, recommend to take them daily. RTC one year

## 2017-03-22 NOTE — Telephone Encounter (Addendum)
Patient scheduled AWV with Glenard Haring at 8am and 8:45pm follow up with Dr. Larose Kells, please advise if enough time is allotted to conduct medicare wellness appointment, please advise

## 2017-03-22 NOTE — Progress Notes (Signed)
Subjective:    Patient ID: Dennis Huff, male    DOB: February 19, 1946, 70 y.o.   MRN: [id number]  DOS:  03/22/2017 Type of visit - description : rov Interval history: No major concerns. High cholesterol: Good med compliance, due for labs HTN: On no medications, BPs are normal. B12 deficiency: Taking supplements most days. Hyperglycemia: Due to check A1c    Review of Systems   Denies chest pain or difficulty breathing No nausea, vomiting, diarrhea. No dysuria, gross hematuria difficulty urinating  Past Medical History:  Diagnosis Date  . B12 deficiency    mild   . 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 Hyperlipidemia   . Hypertension   . Normal cardiac stress test 11-2010    done for an abnormal EKG    Past Surgical History:  Procedure Laterality Date  . NO PAST SURGERIES      Social History   Social History  . Marital status: Married    Spouse name: N/A  . Number of children: 2  . Years of education: N/A   Occupational History  . retired -- Chief Executive Officer   Social History Main Topics  . Smoking status: Never Smoker  . Smokeless tobacco: Never Used  . Alcohol use Yes     Comment: rarely  . Drug use: No  . Sexual activity: Not on file   Other Topics Concern  . Not on file   Social History Narrative   Lives w/ wife, lost mother in law ~ 2017   2 adult and independent children      Allergies as of 03/22/2017   No Known Allergies     Medication List       Accurate as of 03/22/17  5:02 PM. Always use your most recent med list.          aspirin 81 MG tablet Take 81 mg by mouth daily.   meloxicam 15 MG tablet Commonly known as:  MOBIC Take 1 tablet (15 mg total) by mouth daily as needed for pain.   multivitamin tablet Take 1 tablet by mouth daily.   rosuvastatin 5 MG tablet Commonly known as:  CRESTOR Take 1 tablet (5 mg total) by mouth daily.   Zoster Vac Recomb Adjuvanted  injection Commonly known as:  SHINGRIX Inject 0.5 mLs into the muscle once.          Objective:   Physical Exam BP 126/78 (BP Location: Left Arm, Patient Position: Sitting, Cuff Size: Normal)   Pulse 78   Temp 98 F (36.7 C) (Oral)   Resp 14   Ht 6\' 1"  (1.854 m)   Wt 204 lb 4 oz (92.6 kg)   SpO2 98%   BMI 26.95 kg/m  General:   Well developed, well nourished . NAD.  Neck: No  thyromegaly  HEENT:  Normocephalic . Face symmetric, atraumatic Lungs:  CTA B Normal respiratory effort, no intercostal retractions, no accessory muscle use. Heart: RRR,  no murmur.  No pretibial edema bilaterally  Abdomen:  Not distended, soft, non-tender. No rebound or rigidity.   Rectal:  External abnormalities: none. Normal sphincter tone. No rectal masses or tenderness.  Stool brown  Prostate: Prostate gland firm and smooth, mildly enlarged with no nodularity, tenderness, mass, asymmetry or induration.  Skin: Exposed areas without rash. Not pale. Not jaundice Neurologic:  alert & oriented X3.  Speech normal, gait appropriate for age and unassisted  Strength symmetric and appropriate for age.  Psych: Cognition and judgment appear intact.  Cooperative with normal attention span and concentration.  Behavior appropriate. No anxious or depressed appearing.    Assessment & Plan:   Assessment Hyperglycemia, A1c 5.9  2010 HTN ? On no meds Hyperlipidemia: Intolerant to Lipitor, Pravachol MSK: on prn meloxicam Mild B12 deficiency-- oral supplements  Abnormal EKG, abnormal stress test 11-2010 Cytopenia, hematology evaluation 2011, Rx observation  Plan: Hyperglycemia: Check A1c and TSH. HTN? On no meds, BP normal Hyperlipidemia: On Crestor, check a CMP and FLP B12 deficiency: On oral supplements, recommend to take them daily. RTC one year

## 2017-03-23 LAB — HEPATITIS C ANTIBODY: HCV AB: NEGATIVE

## 2017-03-24 NOTE — Addendum Note (Signed)
Addended byDamita Dunnings D on: 03/24/2017 04:48 PM   Modules accepted: Orders

## 2017-09-22 ENCOUNTER — Encounter: Payer: Self-pay | Admitting: Gastroenterology

## 2017-09-24 ENCOUNTER — Other Ambulatory Visit (INDEPENDENT_AMBULATORY_CARE_PROVIDER_SITE_OTHER): Payer: Medicare Other

## 2017-09-24 DIAGNOSIS — R972 Elevated prostate specific antigen [PSA]: Secondary | ICD-10-CM | POA: Diagnosis not present

## 2017-09-24 LAB — PSA: PSA: 5.49 ng/mL — AB (ref 0.10–4.00)

## 2017-09-27 NOTE — Addendum Note (Signed)
Addended byDamita Dunnings D on: 09/27/2017 08:41 AM   Modules accepted: Orders

## 2017-10-27 DIAGNOSIS — R972 Elevated prostate specific antigen [PSA]: Secondary | ICD-10-CM | POA: Diagnosis not present

## 2017-11-05 DIAGNOSIS — C61 Malignant neoplasm of prostate: Secondary | ICD-10-CM

## 2017-11-05 HISTORY — DX: Malignant neoplasm of prostate: C61

## 2017-11-10 DIAGNOSIS — D075 Carcinoma in situ of prostate: Secondary | ICD-10-CM | POA: Diagnosis not present

## 2017-11-10 DIAGNOSIS — C61 Malignant neoplasm of prostate: Secondary | ICD-10-CM | POA: Diagnosis not present

## 2017-11-10 DIAGNOSIS — R972 Elevated prostate specific antigen [PSA]: Secondary | ICD-10-CM | POA: Diagnosis not present

## 2017-11-17 ENCOUNTER — Ambulatory Visit (AMBULATORY_SURGERY_CENTER): Payer: Self-pay

## 2017-11-17 ENCOUNTER — Other Ambulatory Visit: Payer: Self-pay

## 2017-11-17 ENCOUNTER — Encounter: Payer: Self-pay | Admitting: Gastroenterology

## 2017-11-17 VITALS — Ht 73.0 in | Wt 206.0 lb

## 2017-11-17 DIAGNOSIS — Z1211 Encounter for screening for malignant neoplasm of colon: Secondary | ICD-10-CM

## 2017-11-17 DIAGNOSIS — C61 Malignant neoplasm of prostate: Secondary | ICD-10-CM | POA: Diagnosis not present

## 2017-11-22 ENCOUNTER — Encounter: Payer: Self-pay | Admitting: Radiation Oncology

## 2017-11-25 DIAGNOSIS — C61 Malignant neoplasm of prostate: Secondary | ICD-10-CM | POA: Insufficient documentation

## 2017-12-01 ENCOUNTER — Ambulatory Visit (AMBULATORY_SURGERY_CENTER): Payer: Medicare Other | Admitting: Gastroenterology

## 2017-12-01 ENCOUNTER — Other Ambulatory Visit: Payer: Self-pay

## 2017-12-01 ENCOUNTER — Encounter: Payer: Self-pay | Admitting: Gastroenterology

## 2017-12-01 VITALS — BP 120/74 | HR 58 | Temp 98.2°F | Resp 14 | Ht 73.0 in | Wt 206.0 lb

## 2017-12-01 DIAGNOSIS — D125 Benign neoplasm of sigmoid colon: Secondary | ICD-10-CM | POA: Diagnosis not present

## 2017-12-01 DIAGNOSIS — Z86010 Personal history of colonic polyps: Secondary | ICD-10-CM | POA: Diagnosis not present

## 2017-12-01 DIAGNOSIS — Z1211 Encounter for screening for malignant neoplasm of colon: Secondary | ICD-10-CM

## 2017-12-01 DIAGNOSIS — K5140 Inflammatory polyps of colon without complications: Secondary | ICD-10-CM | POA: Diagnosis not present

## 2017-12-01 MED ORDER — SODIUM CHLORIDE 0.9 % IV SOLN: 500.0000 mL | Freq: Once | INTRAVENOUS | Status: DC

## 2017-12-01 NOTE — Op Note (Signed)
[facility] Patient Name: Dennis Huff Procedure Date: 12/01/2017 10:17 AM MRN: [id number] Endoscopist: Mallie Mussel L. Loletha Carrow , MD Age: 72 Referring MD:  Date of Birth: 03-27-1946 Gender: Male Account #: [id number] Procedure:                Colonoscopy Indications:              Screening for colorectal malignant neoplasm Medicines:                Monitored Anesthesia Care Procedure:                Pre-Anesthesia Assessment:                           - Prior to the procedure, a History and Physical                            was performed, and patient medications and                            allergies were reviewed. The patient's tolerance of                            previous anesthesia was also reviewed. The risks                            and benefits of the procedure and the sedation                            options and risks were discussed with the patient.                            All questions were answered, and informed consent                            was obtained. Prior Anticoagulants: The patient has                            taken no previous anticoagulant or antiplatelet                            agents. ASA Grade Assessment: II - A patient with                            mild systemic disease. After reviewing the risks                            and benefits, the patient was deemed in                            satisfactory condition to undergo the procedure.                           After obtaining informed consent, the colonoscope  was passed under direct vision. Throughout the                            procedure, the patient's blood pressure, pulse, and                            oxygen saturations were monitored continuously. The                            Colonoscope was introduced through the anus and                            advanced to the the cecum, identified by                            appendiceal orifice and  ileocecal valve. The                            colonoscopy was performed without difficulty. The                            patient tolerated the procedure well. The quality                            of the bowel preparation was excellent. The                            ileocecal valve, appendiceal orifice, and rectum                            were photographed. Scope In: 10:34:10 AM Scope Out: 11:05:44 AM Scope Withdrawal Time: 0 hours 28 minutes 19 seconds  Total Procedure Duration: 0 hours 31 minutes 34 seconds  Findings:                 The perianal and digital rectal examinations were                            normal.                           A 5 mm polyp was found in the distal sigmoid colon.                            The polyp was semi-pedunculated. The polyp was                            removed with a hot snare. Resection and retrieval                            were complete. Estimated blood loss was minimal.                            For hemostasis, one hemostatic clip was  successfully placed (MR conditional). There was no                            bleeding at the end of the procedure.                           The exam was otherwise without abnormality on                            direct and retroflexion views. Complications:            No immediate complications. Estimated Blood Loss:     Estimated blood loss was minimal. Impression:               - One 5 mm polyp in the distal sigmoid colon,                            removed with a hot snare. Resected and retrieved.                            Clip (MR conditional) was placed.                           - The examination was otherwise normal on direct                            and retroflexion views. Recommendation:           - Patient has a contact number available for                            emergencies. The signs and symptoms of potential                            delayed  complications were discussed with the                            patient. Return to normal activities tomorrow.                            Written discharge instructions were provided to the                            patient.                           - Resume previous diet.                           - Continue present medications.                           - Await pathology results (possibly inflammatory                            polyp).                           -  Repeat colonoscopy is recommended for                            surveillance. The colonoscopy date will be                            determined after pathology results from today's                            exam become available for review. [name], MD 12/01/2017 11:18:55 AM This report has been signed electronically.

## 2017-12-01 NOTE — Patient Instructions (Signed)
**   Handout given on polyps and clip placement **   YOU HAD AN ENDOSCOPIC PROCEDURE TODAY AT [facility]:   Refer to the procedure report that was given to you for any specific questions about what was found during the examination.  If the procedure report does not answer your questions, please call your gastroenterologist to clarify.  If you requested that your care partner not be given the details of your procedure findings, then the procedure report has been included in a sealed envelope for you to review at your convenience later.  YOU SHOULD EXPECT: Some feelings of bloating in the abdomen. Passage of more gas than usual.  Walking can help get rid of the air that was put into your GI tract during the procedure and reduce the bloating. If you had a lower endoscopy (such as a colonoscopy or flexible sigmoidoscopy) you may notice spotting of blood in your stool or on the toilet paper. If you underwent a bowel prep for your procedure, you may not have a normal bowel movement for a few days.  Please Note:  You might notice some irritation and congestion in your nose or some drainage.  This is from the oxygen used during your procedure.  There is no need for concern and it should clear up in a day or so.  SYMPTOMS TO REPORT IMMEDIATELY:   Following lower endoscopy (colonoscopy or flexible sigmoidoscopy):  Excessive amounts of blood in the stool  Significant tenderness or worsening of abdominal pains  Swelling of the abdomen that is new, acute  Fever of 100F or higher  For urgent or emergent issues, a gastroenterologist can be reached at any hour by calling [phone].   DIET:  We do recommend a small meal at first, but then you may proceed to your regular diet.  Drink plenty of fluids but you should avoid alcoholic beverages for 24 hours.  ACTIVITY:  You should plan to take it easy for the rest of today and you should NOT DRIVE or use heavy machinery until tomorrow (because  of the sedation medicines used during the test).    FOLLOW UP: Our staff will call the number listed on your records the next business day following your procedure to check on you and address any questions or concerns that you may have regarding the information given to you following your procedure. If we do not reach you, we will leave a message.  However, if you are feeling well and you are not experiencing any problems, there is no need to return our call.  We will assume that you have returned to your regular daily activities without incident.  If any biopsies were taken you will be contacted by phone or by letter within the next 1-3 weeks.  Please call us at [phone] if you have not heard about the biopsies in 3 weeks.    SIGNATURES/CONFIDENTIALITY: You and/or your care partner have signed paperwork which will be entered into your electronic medical record.  These signatures attest to the fact that that the information above on your After Visit Summary has been reviewed and is understood.  Full responsibility of the confidentiality of this discharge information lies with you and/or your care-partner.

## 2017-12-01 NOTE — Progress Notes (Signed)
Pt's states no medical or surgical changes since previsit or office visit. 

## 2017-12-01 NOTE — Progress Notes (Signed)
Called to room to assist during endoscopic procedure.  Patient ID and intended procedure confirmed with present staff. Received instructions for my participation in the procedure from the performing physician.  

## 2017-12-01 NOTE — Progress Notes (Signed)
Report given to PACU, vss 

## 2017-12-01 NOTE — Progress Notes (Signed)
[place] in place above IV, amount of time in place around 1 hour.  Dr Loletha Carrow aware, vss

## 2017-12-02 ENCOUNTER — Telehealth: Payer: Self-pay

## 2017-12-02 NOTE — Telephone Encounter (Signed)
  Follow up Call-  Call back number 12/01/2017  Post procedure Call Back phone  # [phone] cell  Permission to leave phone message Yes  Some recent data might be hidden     Patient questions:  Do you have a fever, pain , or abdominal swelling? No. Pain Score  0 *  Have you tolerated food without any problems? Yes.    Have you been able to return to your normal activities? Yes.    Do you have any questions about your discharge instructions: Diet   No. Medications  No. Follow up visit  No.  Do you have questions or concerns about your Care? No.  Actions: * If pain score is 4 or above: No action needed, pain <4.

## 2017-12-06 ENCOUNTER — Encounter: Payer: Self-pay | Admitting: Gastroenterology

## 2017-12-17 ENCOUNTER — Encounter: Payer: Self-pay | Admitting: Radiation Oncology

## 2017-12-17 NOTE — Progress Notes (Addendum)
GU Location of Tumor / Histology: prostatic adenocarcinoma  If Prostate Cancer, Gleason Score is (4 + 3) and PSA is (5.4). Prostate volume: 43.76 grams  09/24/2017 PSA  5.49 05/11/2017 PSA  5.4 03/2017  PSA  4.58 2015  PSA  2.28  Dennis Huff was referred by Dr. Kathlene November to Dr. Pilar Jarvis in February 2019 for further evaluation of an elevated PSA.   Biopsies of prostate (if applicable) revealed:    Past/Anticipated interventions by urology, if any: prostate biopsy (first), discussion ref prostatectomy (Wed.April 17th at 2 pm follow up at [place]), referral to radiation oncology  Past/Anticipated interventions by medical oncology, if any: no  Weight changes, if any: no  Bowel/Bladder complaints, if any: IPSS 10. Denies dysuria, hematuria, urgency, or incontinence. Reports leakage only if he prolongs voiding.  Nausea/Vomiting, if any: no  Pain issues, if any:  no  SAFETY ISSUES:  Prior radiation? no  Pacemaker/ICD? no  Possible current pregnancy? no  Is the patient on methotrexate? no  Current Complaints / other details:  72 year old male. Married with one daughter and one son. Resides in [facility]. Retired. [employer]. Patient leaning toward external beam radiation therapy.

## 2017-12-20 ENCOUNTER — Ambulatory Visit
Admission: RE | Admit: 2017-12-20 | Discharge: 2017-12-20 | Disposition: A | Discharge status: Home / Self Care | Payer: Medicare Other | Source: Ambulatory Visit | Attending: Radiation Oncology | Admitting: Radiation Oncology

## 2017-12-20 ENCOUNTER — Other Ambulatory Visit: Payer: Self-pay

## 2017-12-20 ENCOUNTER — Encounter: Payer: Self-pay | Admitting: Radiation Oncology

## 2017-12-20 VITALS — BP 161/83 | HR 66 | Temp 98.2°F | Resp 16 | Ht 73.0 in | Wt 206.5 lb

## 2017-12-20 DIAGNOSIS — Z7982 Long term (current) use of aspirin: Secondary | ICD-10-CM | POA: Diagnosis not present

## 2017-12-20 DIAGNOSIS — E785 Hyperlipidemia, unspecified: Secondary | ICD-10-CM | POA: Diagnosis not present

## 2017-12-20 DIAGNOSIS — I10 Essential (primary) hypertension: Secondary | ICD-10-CM | POA: Diagnosis not present

## 2017-12-20 DIAGNOSIS — Z79899 Other long term (current) drug therapy: Secondary | ICD-10-CM | POA: Insufficient documentation

## 2017-12-20 DIAGNOSIS — C61 Malignant neoplasm of prostate: Secondary | ICD-10-CM | POA: Diagnosis not present

## 2017-12-20 DIAGNOSIS — R972 Elevated prostate specific antigen [PSA]: Secondary | ICD-10-CM | POA: Diagnosis not present

## 2017-12-20 NOTE — Progress Notes (Signed)
See progress note under physician encounter. 

## 2017-12-20 NOTE — Progress Notes (Signed)
Radiation Oncology         [phone] ________________________________  Initial Outpatient Consultation  Name: Dennis Huff MRN: [id number]  Date: 12/20/2017  DOB: [birth year]  CC:Huff, Dennis Berthold, MD  Dennis Retort, MD   REFERRING PHYSICIAN: Nickie Retort, MD  DIAGNOSIS: 72 y.o. gentleman with Stage T1c adenocarcinoma of the prostate with Gleason Score of 4+3, and PSA of 5.49    ICD-10-CM   1. Malignant neoplasm of prostate ([place]) C61   2. Prostate cancer [facility]) C61     HISTORY OF PRESENT ILLNESS: Dennis Huff is a 72 y.o. male with a diagnosis of prostate cancer. He was noted to have an elevated PSA of 5.49 in January 2019 by his primary care physician, Dr. Kathlene Huff.  It was 4.58 in July 2018 and 2.28 in January 2016.  Accordingly, he was referred for evaluation in urology to Dr. Pilar Jarvis on 10/27/2017, where a digital rectal examination was performed at that time revealing no prostate nodules.  The patient proceeded to transrectal ultrasound with 12 biopsies of the prostate on 11/10/2017.  The prostate volume measured 43.76 cc.  Out of 12 core biopsies, 7 were positive.  The maximum Gleason score was 4+3, and this was seen in the right mid lateral with perineural invasion.    The patient reviewed the biopsy results with his urologist and he has kindly been referred today for discussion of potential radiation treatment options.    PREVIOUS RADIATION THERAPY: No  PAST MEDICAL HISTORY:  Past Medical History:  Diagnosis Date  . Allergy   . B12 deficiency    mild   . 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 Elevated PSA 10/2016   planned prostate bx  . Hyperlipidemia   . Hypertension   . Normal cardiac stress test 11-2010    done for an abnormal EKG  . Prostate cancer ([place]) 11/2017      PAST SURGICAL HISTORY: Past Surgical History:  Procedure Laterality Date  . PROSTATE BIOPSY    . WISDOM TOOTH EXTRACTION       FAMILY HISTORY:  Family History  Problem Relation Age of Onset  . Diabetes Mother   . Coronary artery disease Other        GF---> MI at around 72 y/o  . Colon cancer Neg Hx   . Stroke Neg Hx   . Esophageal cancer Neg Hx   . Liver cancer Neg Hx   . Pancreatic cancer Neg Hx   . Rectal cancer Neg Hx   . Stomach cancer Neg Hx   . Prostate cancer Neg Hx     SOCIAL HISTORY:  Social History   Socioeconomic History  . Marital status: Married    Spouse name: Not on file  . Number of children: 2  . Years of education: Not on file  . Highest education level: Not on file  Occupational History  . Occupation: retired -- Engineer, building services: [employer]  . Financial resource strain: Not on file  . Food insecurity:    Worry: Not on file    Inability: Not on file  . Transportation needs:    Medical: Not on file    Non-medical: Not on file  Tobacco Use  . Smoking status: Never Smoker  . Smokeless tobacco: Never Used  Substance and Sexual Activity  . Alcohol use: No    Frequency: Never  .  Drug use: No  . Sexual activity: Yes  Lifestyle  . Physical activity:    Days per week: Not on file    Minutes per session: Not on file  . Stress: Not on file  Relationships  . Social connections:    Talks on phone: Not on file    Gets together: Not on file    Attends religious service: Not on file    Active member of club or organization: Not on file    Attends meetings of clubs or organizations: Not on file    Relationship status: Not on file  . Intimate partner violence:    Fear of current or ex partner: Not on file    Emotionally abused: Not on file    Physically abused: Not on file    Forced sexual activity: Not on file  Other Topics Concern  . Not on file  Social History Narrative   Lives w/ wife, lost mother in law ~ 2017   2 adult and independent children    ALLERGIES: Patient has no known allergies.  MEDICATIONS:  Current Outpatient Medications   Medication Sig Dispense Refill  . aspirin 81 MG tablet Take 81 mg by mouth daily.      . Multiple Vitamin (MULTIVITAMIN) tablet Take 1 tablet by mouth daily.    . rosuvastatin (CRESTOR) 5 MG tablet Take 1 tablet (5 mg total) by mouth daily. 90 tablet 3   Current Facility-Administered Medications  Medication Dose Route Frequency Provider Last Rate Last Dose  . 0.9 %  sodium chloride infusion  500 mL Intravenous Once Doran Stabler, MD        REVIEW OF SYSTEMS:  On review of systems, the patient reports that he is doing well overall. He denies any chest pain, shortness of breath, cough, fevers, chills, night sweats, or unintended weight changes. He denies any bowel disturbances, and denies abdominal pain, nausea or vomiting. He denies any new musculoskeletal or joint aches or pains. His IPSS was 10, indicating moderate urinary symptoms of frequency, weak stream, urgency, and nocturia x1. He denies dysuria, hematuria, or incontinence. He reports leakage only if he prolongs voiding. He is able to complete sexual activity with most attempts. A complete review of systems is obtained and is otherwise negative.    PHYSICAL EXAM:  Wt Readings from Last 3 Encounters:  12/20/17 206 lb 8 oz (93.7 kg)  12/01/17 206 lb (93.4 kg)  11/17/17 206 lb (93.4 kg)   Temp Readings from Last 3 Encounters:  12/20/17 98.2 F (36.8 C) (Oral)  12/01/17 98.2 F (36.8 C)  03/22/17 98 F (36.7 C) (Oral)   BP Readings from Last 3 Encounters:  12/20/17 (!) 161/83  12/01/17 120/74  03/22/17 126/78   Pulse Readings from Last 3 Encounters:  12/20/17 66  12/01/17 (!) 58  03/22/17 78   Pain Assessment Pain Score: 0-No pain/10  In general this is a well appearing African-American male in no acute distress. He is alert and oriented x4 and appropriate throughout the examination. HEENT reveals that the patient is normocephalic, atraumatic. EOMs are intact. PERRLA. Skin is intact without any evidence of gross  lesions. Cardiovascular exam reveals a regular rate and rhythm, no clicks rubs or murmurs are auscultated. Chest is clear to auscultation bilaterally. Abdomen has active bowel sounds in all quadrants and is intact. The abdomen is soft, non tender, non distended.   KPS = 100  100 - Normal; no complaints; no evidence of disease. 90   -  Able to carry on normal activity; minor signs or symptoms of disease. 80   - Normal activity with effort; some signs or symptoms of disease. 17   - Cares for self; unable to carry on normal activity or to do active work. 60   - Requires occasional assistance, but is able to care for most of his personal needs. 50   - Requires considerable assistance and frequent medical care. 60   - Disabled; requires special care and assistance. 72   - Severely disabled; hospital admission is indicated although death not imminent. 76   - Very sick; hospital admission necessary; active supportive treatment necessary. 10   - Moribund; fatal processes progressing rapidly. 0     - Dead  Karnofsky DA, Abelmann [place], Craver LS and Burchenal [facility] [phone]) The use of the nitrogen mustards in the palliative treatment of carcinoma: with particular reference to bronchogenic carcinoma Cancer 1 634-56  LABORATORY DATA:  Lab Results  Component Value Date   WBC 3.4 (L) 03/20/2016   HGB 13.7 03/20/2016   HCT 41.4 03/20/2016   MCV 84.6 03/20/2016   PLT 225.0 03/20/2016   Lab Results  Component Value Date   NA 141 03/22/2017   K 4.3 03/22/2017   CL 106 03/22/2017   CO2 25 03/22/2017   Lab Results  Component Value Date   ALT 24 03/22/2017   AST 21 03/22/2017   ALKPHOS 82 03/22/2017   BILITOT 0.8 03/22/2017     RADIOGRAPHY: No results found.    IMPRESSION/PLAN: 1. 72 y.o. gentleman with Stage T1c adenocarcinoma of the prostate with Gleason Score of 4+3, and PSA of 5.49. We discussed the nature of prostate disease. He reviews how T stage, Gleason score, and PSA impact treatment  recommendations. He is a candidate for surgical resection versus external beam radiotherapy versus seed implant . We discussed the risks, benefits, short, and long term effects of radiotherapy, and the patient is interested in proceeding and is leaning toward external beam radiotherapy over 5 1/2 weeks. He understands the need to proceed with fiducial marker placement and space OAR gel which may take place in the office. He meets back with Dr. Pilar Jarvis on Wednesday. I'll check in with him Thursday to see how he'd like to proceed.       Carola Rhine, [facility]   Page Me  Seen with  _____________________________________  Sheral Apley Tammi Klippel, M.D.  This document serves as a record of services personally performed by Tyler Pita, MD and Shona Simpson, PA-C. It was created on their behalf by Rae Lips, a trained medical scribe. The creation of this record is based on the scribe's personal observations and the providers' statements to them. This document has been checked and approved by the attending providers.

## 2018-01-05 ENCOUNTER — Telehealth: Payer: Self-pay | Admitting: Radiation Oncology

## 2018-01-05 DIAGNOSIS — C61 Malignant neoplasm of prostate: Secondary | ICD-10-CM | POA: Diagnosis not present

## 2018-01-05 NOTE — Telephone Encounter (Signed)
I spoke with the patient. He has decided to move forward with external beam radiotherapy. He will need fiducial markers and SpaceOAR performed prior to this. I will reach out to our scheduler to start this process.

## 2018-02-17 ENCOUNTER — Ambulatory Visit: Payer: Self-pay | Admitting: *Deleted

## 2018-02-17 DIAGNOSIS — C61 Malignant neoplasm of prostate: Secondary | ICD-10-CM | POA: Diagnosis not present

## 2018-02-17 NOTE — Telephone Encounter (Signed)
BP was high last week- 172/82- it was taken by home health care nurse. Urologist 180/110- today. Patient states he is not on medication for high BP at this time. Appointment given for BP check in office.   Reason for Disposition . Systolic BP  >= 119 OR Diastolic >= 417  Answer Assessment - Initial Assessment Questions 1. BLOOD PRESSURE: "What is the blood pressure?" "Did you take at least two measurements 5 minutes apart?"     180/110- BP was taken at urology office today- only checked 1 time 2. ONSET: "When did you take your blood pressure?"     This morning 3. HOW: "How did you obtain the blood pressure?" (e.g., visiting nurse, automatic home BP monitor)    Automatic cuff 4. HISTORY: "Do you have a history of high blood pressure?"     No history of high BP 5. MEDICATIONS: "Are you taking any medications for blood pressure?" "Have you missed any doses recently?"     No medications 6. OTHER SYMPTOMS: "Do you have any symptoms?" (e.g., headache, chest pain, blurred vision, difficulty breathing, weakness)     no 7. PREGNANCY: "Is there any chance you are pregnant?" "When was your last menstrual period?"     n/a  Protocols used: HIGH BLOOD PRESSURE-A-AH

## 2018-02-18 ENCOUNTER — Ambulatory Visit (INDEPENDENT_AMBULATORY_CARE_PROVIDER_SITE_OTHER): Payer: Medicare Other | Admitting: Internal Medicine

## 2018-02-18 ENCOUNTER — Encounter: Payer: Self-pay | Admitting: Internal Medicine

## 2018-02-18 VITALS — BP 152/98 | HR 72 | Temp 98.2°F | Resp 16 | Ht 73.0 in | Wt 205.2 lb

## 2018-02-18 DIAGNOSIS — I10 Essential (primary) hypertension: Secondary | ICD-10-CM | POA: Diagnosis not present

## 2018-02-18 MED ORDER — AMLODIPINE BESYLATE 5 MG PO TABS: 5.0000 mg | ORAL_TABLET | Freq: Every day | ORAL | 6 refills | Status: DC

## 2018-02-18 NOTE — Patient Instructions (Signed)
Start amlodipine 5 mg 1 tablet every day.  May take several days to few weeks to work  Watch for lower extremity edema   Check the  blood pressure 2 or 3 times a  week   Be sure your blood pressure is between 110/65 and  135/85. If it is consistently higher or lower, let me know    DASH Eating Plan DASH stands for "Dietary Approaches to Stop Hypertension." The DASH eating plan is a healthy eating plan that has been shown to reduce high blood pressure (hypertension). It may also reduce your risk for type 2 diabetes, heart disease, and stroke. The DASH eating plan may also help with weight loss. What are tips for following this plan? General guidelines  Avoid eating more than 2,300 mg (milligrams) of salt (sodium) a day. If you have hypertension, you may need to reduce your sodium intake to 1,500 mg a day.  Limit alcohol intake to no more than 1 drink a day for nonpregnant women and 2 drinks a day for men. One drink equals 12 oz of beer, 5 oz of wine, or 1 oz of hard liquor.  Work with your health care provider to maintain a healthy body weight or to lose weight. Ask what an ideal weight is for you.  Get at least 30 minutes of exercise that causes your heart to beat faster (aerobic exercise) most days of the week. Activities may include walking, swimming, or biking.  Work with your health care provider or diet and nutrition specialist (dietitian) to adjust your eating plan to your individual calorie needs. Reading food labels  Check food labels for the amount of sodium per serving. Choose foods with less than 5 percent of the Daily Value of sodium. Generally, foods with less than 300 mg of sodium per serving fit into this eating plan.  To find whole grains, look for the word "whole" as the first word in the ingredient list. Shopping  Buy products labeled as "low-sodium" or "no salt added."  Buy fresh foods. Avoid canned foods and premade or frozen meals. Cooking  Avoid adding  salt when cooking. Use salt-free seasonings or herbs instead of table salt or sea salt. Check with your health care provider or pharmacist before using salt substitutes.  Do not fry foods. Cook foods using healthy methods such as baking, boiling, grilling, and broiling instead.  Cook with heart-healthy oils, such as olive, canola, soybean, or sunflower oil. Meal planning   Eat a balanced diet that includes: ? 5 or more servings of fruits and vegetables each day. At each meal, try to fill half of your plate with fruits and vegetables. ? Up to 6-8 servings of whole grains each day. ? Less than 6 oz of lean meat, poultry, or fish each day. A 3-oz serving of meat is about the same size as a deck of cards. One egg equals 1 oz. ? 2 servings of low-fat dairy each day. ? A serving of nuts, seeds, or beans 5 times each week. ? Heart-healthy fats. Healthy fats called Omega-3 fatty acids are found in foods such as flaxseeds and coldwater fish, like sardines, salmon, and mackerel.  Limit how much you eat of the following: ? Canned or prepackaged foods. ? Food that is high in trans fat, such as fried foods. ? Food that is high in saturated fat, such as fatty meat. ? Sweets, desserts, sugary drinks, and other foods with added sugar. ? Full-fat dairy products.  Do not salt foods  before eating.  Try to eat at least 2 vegetarian meals each week.  Eat more home-cooked food and less restaurant, buffet, and fast food.  When eating at a restaurant, ask that your food be prepared with less salt or no salt, if possible. What foods are recommended? The items listed may not be a complete list. Talk with your dietitian about what dietary choices are best for you. Grains Whole-grain or whole-wheat bread. Whole-grain or whole-wheat pasta. Brown rice. [place]. Bulgur. Whole-grain and low-sodium cereals. Pita bread. Low-fat, low-sodium crackers. Whole-wheat flour tortillas. Vegetables Fresh or frozen  vegetables (raw, steamed, roasted, or grilled). Low-sodium or reduced-sodium tomato and vegetable juice. Low-sodium or reduced-sodium tomato sauce and tomato paste. Low-sodium or reduced-sodium canned vegetables. Fruits All fresh, dried, or frozen fruit. Canned fruit in natural juice (without added sugar). Meat and other protein foods Skinless chicken or [place]. Ground chicken or [place]. Pork with fat trimmed off. Fish and seafood. Egg whites. Dried beans, peas, or lentils. Unsalted nuts, nut butters, and seeds. Unsalted canned beans. Lean cuts of beef with fat trimmed off. Low-sodium, lean deli meat. Dairy Low-fat (1%) or fat-free (skim) milk. Fat-free, low-fat, or reduced-fat cheeses. Nonfat, low-sodium ricotta or cottage cheese. Low-fat or nonfat yogurt. Low-fat, low-sodium cheese. Fats and oils Soft margarine without trans fats. Vegetable oil. Low-fat, reduced-fat, or light mayonnaise and salad dressings (reduced-sodium). Canola, safflower, olive, soybean, and sunflower oils. Avocado. Seasoning and other foods Herbs. Spices. Seasoning mixes without salt. Unsalted popcorn and pretzels. Fat-free sweets. What foods are not recommended? The items listed may not be a complete list. Talk with your dietitian about what dietary choices are best for you. Grains Baked goods made with fat, such as croissants, muffins, or some breads. Dry pasta or rice meal packs. Vegetables Creamed or fried vegetables. Vegetables in a cheese sauce. Regular canned vegetables (not low-sodium or reduced-sodium). Regular canned tomato sauce and paste (not low-sodium or reduced-sodium). Regular tomato and vegetable juice (not low-sodium or reduced-sodium). Angie Fava. Olives. Fruits Canned fruit in a light or heavy syrup. Fried fruit. Fruit in cream or butter sauce. Meat and other protein foods Fatty cuts of meat. Ribs. Fried meat. Berniece Salines. Sausage. Bologna and other processed lunch meats. Salami. Fatback. Hotdogs. Bratwurst.  Salted nuts and seeds. Canned beans with added salt. Canned or smoked fish. Whole eggs or egg yolks. Chicken or [place] with skin. Dairy Whole or 2% milk, cream, and half-and-half. Whole or full-fat cream cheese. Whole-fat or sweetened yogurt. Full-fat cheese. Nondairy creamers. Whipped toppings. Processed cheese and cheese spreads. Fats and oils Butter. Stick margarine. Lard. Shortening. Ghee. Bacon fat. Tropical oils, such as coconut, palm kernel, or palm oil. Seasoning and other foods Salted popcorn and pretzels. Onion salt, garlic salt, seasoned salt, table salt, and sea salt. Worcestershire sauce. Tartar sauce. Barbecue sauce. Teriyaki sauce. Soy sauce, including reduced-sodium. Steak sauce. Canned and packaged gravies. Fish sauce. Oyster sauce. Cocktail sauce. Horseradish that you find on the shelf. Ketchup. Mustard. Meat flavorings and tenderizers. Bouillon cubes. Hot sauce and Tabasco sauce. Premade or packaged marinades. Premade or packaged taco seasonings. Relishes. Regular salad dressings. Where to find more information:  National Heart, Lung, and [place]: https://wilson-eaton.com/  American Heart Association: www.heart.org Summary  The DASH eating plan is a healthy eating plan that has been shown to reduce high blood pressure (hypertension). It may also reduce your risk for type 2 diabetes, heart disease, and stroke.  With the DASH eating plan, you should limit salt (sodium) intake to 2,300 mg  a day. If you have hypertension, you may need to reduce your sodium intake to 1,500 mg a day.  When on the DASH eating plan, aim to eat more fresh fruits and vegetables, whole grains, lean proteins, low-fat dairy, and heart-healthy fats.  Work with your health care provider or diet and nutrition specialist (dietitian) to adjust your eating plan to your individual calorie needs. This information is not intended to replace advice given to you by your health care provider. Make sure you discuss any  questions you have with your health care provider. Document Released: 08/13/2011 Document Revised: 08/17/2016 Document Reviewed: 08/17/2016 Elsevier Interactive Patient Education  [employer].

## 2018-02-18 NOTE — Progress Notes (Signed)
Subjective:    Patient ID: Dennis Huff, male    DOB: [birth year], 72 y.o.   MRN: [id number]  DOS:  02/18/2018 Type of visit - description : acute,  last visit 03-2017 Interval history:  Since the last visit, has been diagnosed with prostate cancer, plans to proceed with external radiation therapy.  The reason he is here is because his BP has been elevated lately. At home, systolic BP has been in the 160s, 150s. A nurse check his BP 2 weeks ago 172/82 At the urology office yesterday 180/110.   Review of Systems His diet and exercise are about the same, room for improvement. Denies chest pain, difficulty breathing. No nausea, headache, lower extremities edema.   Past Medical History:  Diagnosis Date  . Allergy   . B12 deficiency    mild   . 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 Elevated PSA 10/2016   planned prostate bx  . Hyperlipidemia   . Hypertension   . Normal cardiac stress test 11-2010    done for an abnormal EKG  . Prostate cancer ([place]) 11/2017    Past Surgical History:  Procedure Laterality Date  . PROSTATE BIOPSY    . WISDOM TOOTH EXTRACTION      Social History   Socioeconomic History  . Marital status: Married    Spouse name: Not on file  . Number of children: 2  . Years of education: Not on file  . Highest education level: Not on file  Occupational History  . Occupation: retired -- Engineer, building services: [employer]  . Financial resource strain: Not on file  . Food insecurity:    Worry: Not on file    Inability: Not on file  . Transportation needs:    Medical: Not on file    Non-medical: Not on file  Tobacco Use  . Smoking status: Never Smoker  . Smokeless tobacco: Never Used  Substance and Sexual Activity  . Alcohol use: No    Frequency: Never  . Drug use: No  . Sexual activity: Yes  Lifestyle  . Physical activity:    Days per week: Not on file    Minutes per  session: Not on file  . Stress: Not on file  Relationships  . Social connections:    Talks on phone: Not on file    Gets together: Not on file    Attends religious service: Not on file    Active member of club or organization: Not on file    Attends meetings of clubs or organizations: Not on file    Relationship status: Not on file  . Intimate partner violence:    Fear of current or ex partner: Not on file    Emotionally abused: Not on file    Physically abused: Not on file    Forced sexual activity: Not on file  Other Topics Concern  . Not on file  Social History Narrative   Lives w/ wife, lost mother in law ~ 2017   2 adult and independent children      Allergies as of 02/18/2018   No Known Allergies     Medication List        Accurate as of 02/18/18  3:08 PM. Always use your most recent med list.          amLODipine 5 MG tablet Commonly known as:  NORVASC  Take 1 tablet (5 mg total) by mouth daily.   aspirin 81 MG tablet Take 81 mg by mouth daily.   multivitamin tablet Take 1 tablet by mouth daily.   rosuvastatin 5 MG tablet Commonly known as:  CRESTOR Take 1 tablet (5 mg total) by mouth daily.          Objective:   Physical Exam BP (!) 152/98 (BP Location: Left Arm, Patient Position: Sitting, Cuff Size: Small)   Pulse 72   Temp 98.2 F (36.8 C) (Oral)   Resp 16   Ht 6\' 1"  (1.854 m)   Wt 205 lb 4 oz (93.1 kg)   SpO2 97%   BMI 27.08 kg/m  General:   Well developed, NAD, see BMI.  HEENT:  Normocephalic . Face symmetric, atraumatic Lungs:  CTA B Normal respiratory effort, no intercostal retractions, no accessory muscle use. Heart: RRR,  no murmur.  No pretibial edema bilaterally  Skin: Not pale. Not jaundice Neurologic:  alert & oriented X3.  Speech normal, gait appropriate for age and unassisted Psych--  Cognition and judgment appear intact.  Cooperative with normal attention span and concentration.  Behavior appropriate. No anxious or  depressed appearing.      Assessment & Plan:   Assessment HTN started meds 02/2018 Hyperglycemia, A1c 5.9  2010 Hyperlipidemia: Intolerant to Lipitor, Pravachol Prostate cancer: DX 11-2017, patient elected to external XRT MSK: on prn meloxicam Mild B12 deficiency-- oral supplements  Abnormal EKG, abnormal stress test 11-2010 Cytopenia, hematology evaluation 2011, Rx observation  Plan: HTN: Based on multiple recent readings the patient have mild HTN,  EKG today: Sinus bradycardia, T wave inversions seen also on EKG 2011 thus  EKG is stable Plan: Amlodipine 5, BP monitoring. Diet. Exercise. See instructions Prostate cancer: Evaluated by urology and radiation oncology, pt elected external radiation. RTC for CPX scheduled for 03-2018.

## 2018-02-18 NOTE — Progress Notes (Signed)
Pre visit review using our clinic review tool, if applicable. No additional management support is needed unless otherwise documented below in the visit note. 

## 2018-02-18 NOTE — Assessment & Plan Note (Signed)
HTN: Based on multiple recent readings the patient have mild HTN,  EKG today: Sinus bradycardia, T wave inversions seen also on EKG 2011 thus  EKG is stable Plan: Amlodipine 5, BP monitoring. Diet. Exercise. See instructions Prostate cancer: Evaluated by urology and radiation oncology, pt elected external radiation. RTC for CPX scheduled for 03-2018.

## 2018-03-09 ENCOUNTER — Other Ambulatory Visit: Payer: Self-pay

## 2018-03-09 MED ORDER — AMLODIPINE BESYLATE 5 MG PO TABS: 5.0000 mg | ORAL_TABLET | Freq: Every day | ORAL | 1 refills | Status: DC

## 2018-03-16 NOTE — Progress Notes (Signed)
Subjective:   Dennis Huff is a 72 y.o. male who presents for an Initial Medicare Annual Wellness Visit.  Review of Systems No ROS.  Medicare Wellness Visit. Additional risk factors are reflected in the social history. Cardiac Risk Factors include: advanced age (>48men, >8 women);hypertension;male gender;dyslipidemia  Sleep patterns:No issues. 8 hrs per night. Home Safety/Smoke Alarms: Feels safe in home. Smoke alarms in place.  Living environment; residence and Firearm Safety: Lives with wife in 1 story home. Eye- wears bifocals. Does yearly exams.  Male:   CCS- 12/01/17    PSA-  Lab Results  Component Value Date   PSA 5.49 (H) 09/24/2017   PSA 4.58 (H) 03/22/2017   PSA 2.28 09/27/2014      Objective:    Today's Vitals   03/24/18 0831  BP: 140/78  Pulse: 69  SpO2: 97%  Weight: 200 lb 9.6 oz (91 kg)  Height: 6\' 1"  (1.854 m)   Body mass index is 26.47 kg/m.  Advanced Directives 03/24/2018  Does Patient Have a Medical Advance Directive? Yes  Type of Paramedic of [place];Living will  Copy of [place] in Chart? No - copy requested    Current Medications (verified) Outpatient Encounter Medications as of 03/24/2018  Medication Sig  . amLODipine (NORVASC) 5 MG tablet Take 1 tablet (5 mg total) by mouth daily.  Marland Kitchen aspirin 81 MG tablet Take 81 mg by mouth daily.    . Multiple Vitamin (MULTIVITAMIN) tablet Take 1 tablet by mouth daily.  . rosuvastatin (CRESTOR) 5 MG tablet Take 1 tablet (5 mg total) by mouth daily.  . [DISCONTINUED] amLODipine (NORVASC) 5 MG tablet Take 1 tablet (5 mg total) by mouth daily.  . [DISCONTINUED] 0.9 %  sodium chloride infusion    No facility-administered encounter medications on file as of 03/24/2018.     Allergies (verified) Patient has no known allergies.   History: Past Medical History:  Diagnosis Date  . Allergy   . B12 deficiency    mild   . 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 Elevated PSA 10/2016   planned prostate bx  . Hyperlipidemia   . Hypertension   . Normal cardiac stress test 11-2010    done for an abnormal EKG  . Prostate cancer ([place]) 11/2017   Past Surgical History:  Procedure Laterality Date  . PROSTATE BIOPSY    . WISDOM TOOTH EXTRACTION     Family History  Problem Relation Age of Onset  . Diabetes Mother   . Coronary artery disease Other        GF---> MI at around 72 y/o  . Colon cancer Neg Hx   . Stroke Neg Hx   . Esophageal cancer Neg Hx   . Liver cancer Neg Hx   . Pancreatic cancer Neg Hx   . Rectal cancer Neg Hx   . Stomach cancer Neg Hx   . Prostate cancer Neg Hx    Social History   Socioeconomic History  . Marital status: Married    Spouse name: Not on file  . Number of children: 2  . Years of education: Not on file  . Highest education level: Not on file  Occupational History  . 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 Financial resource strain: Not on file  . Food insecurity:    Worry: Not  on file    Inability: Not on file  . Transportation needs:    Medical: Not on file    Non-medical: Not on file  Tobacco Use  . Smoking status: Never Smoker  . Smokeless tobacco: Never Used  Substance and Sexual Activity  . Alcohol use: No    Frequency: Never  . Drug use: No  . Sexual activity: Not Currently  Lifestyle  . Physical activity:    Days per week: Not on file    Minutes per session: Not on file  . Stress: Not on file  Relationships  . Social connections:    Talks on phone: Not on file    Gets together: Not on file    Attends religious service: Not on file    Active member of club or organization: Not on file    Attends meetings of clubs or organizations: Not on file    Relationship status: Not on file  Other Topics Concern  . Not on file  Social History Narrative   Lives w/ wife, lost mother in law ~ 2017   2  adult and independent children   Tobacco Counseling Counseling given: Not Answered   Clinical Intake:     Pain : No/denies pain                 Activities of Daily Living In your present state of health, do you have any difficulty performing the following activities: 03/24/2018  Hearing? N  Vision? N  Difficulty concentrating or making decisions? N  Walking or climbing stairs? N  Dressing or bathing? N  Doing errands, shopping? N  Preparing Food and eating ? N  Using the Toilet? N  In the past six months, have you accidently leaked urine? N  Do you have problems with loss of bowel control? N  Managing your Medications? N  Managing your Finances? N  Housekeeping or managing your Housekeeping? N  Some recent data might be hidden     Immunizations and Health Maintenance Immunization History  Administered Date(s) Administered  . Influenza, High Dose Seasonal PF 06/26/2013  . Pneumococcal Conjugate-13 01/12/2014  . Pneumococcal Polysaccharide-23 04/30/2010  . Td 03/09/2007, 03/22/2017  . Zoster 07/27/2011   There are no preventive care reminders to display for this patient.  Patient Care Team: Colon Branch, MD as PCP - General Pilar Jarvis, Horald Pollen, MD as Consulting Physician (Urology) Alexis Frock, MD as Consulting Physician (Urology)  Indicate any recent Medical Services you may have received from other than [facility] providers in the past year (date may be approximate).    Assessment:   This is a routine wellness examination for Dennis Huff. Physical assessment deferred to PCP.  Hearing/Vision screen  Visual Acuity Screening   Right eye Left eye Both eyes  Without correction:     With correction: 20/20 20/20 20/20   Hearing Screening Comments: Able to hear conversational tones w/o difficulty. No issues reported.    Dietary issues and exercise activities discussed: Current Exercise Habits: The patient does not participate in regular exercise at present Diet  (meal preparation, eat out, water intake, caffeinated beverages, dairy products, fruits and vegetables): 24 hr recall Breakfast: grits with butter and salt, sausage or bacon, OJ, coffee Lunch: hot dog, juice Dinner:  Cabbage and cornbread. Sweet tea. Pt drinks 3 glasses of water per day.  Goals    . Increase physical activity      Depression Screen PHQ 2/9 Scores 03/24/2018 03/22/2017 03/20/2016 07/14/2013  PHQ - 2 Score  0 0 0 0    Fall Risk Fall Risk  03/24/2018 03/22/2017 03/20/2016 07/14/2013  Falls in the past year? No No No No   Cognitive Function: MMSE - Mini Mental State Exam 03/24/2018  Orientation to time 5  Orientation to Place 5  Registration 3  Attention/ Calculation 5  Recall 3  Language- name 2 objects 2  Language- repeat 1  Language- follow 3 step command 3  Language- read & follow direction 1  Write a sentence 1  Copy design 1  Total score 30        Screening Tests Health Maintenance  Topic Date Due  . PNA vac Low Risk Adult (2 of 2 - PPSV23) 03/20/2019 (Originally 05/01/2015)  . INFLUENZA VACCINE  04/07/2018  . TETANUS/TDAP  03/23/2027  . COLONOSCOPY  12/02/2027  . Hepatitis C Screening  Completed      Plan:  Please schedule your next medicare wellness visit with me in 1 yr.  Eat heart healthy diet (full of fruits, vegetables, whole grains, lean protein, water--limit salt, fat, and sugar intake) and increase physical activity as tolerated.  Continue doing brain stimulating activities (puzzles, reading, adult coloring books, staying active) to keep memory sharp.   Bring a copy of your living will and/or healthcare power of attorney to your next office visit.   I have personally reviewed and noted the following in the patient's chart:   . Medical and social history . Use of alcohol, tobacco or illicit drugs  . Current medications and supplements . Functional ability and status . Nutritional status . Physical activity . Advanced directives . List  of other physicians . Hospitalizations, surgeries, and ER visits in previous 12 months . Vitals . Screenings to include cognitive, depression, and falls . Referrals and appointments  In addition, I have reviewed and discussed with patient certain preventive protocols, quality metrics, and best practice recommendations. A written personalized care plan for preventive services as well as general preventive health recommendations were provided to patient.     Naaman Plummer [place], [place]   03/24/2018   Kathlene November, MD

## 2018-03-22 ENCOUNTER — Other Ambulatory Visit: Payer: Self-pay | Admitting: Internal Medicine

## 2018-03-22 MED ORDER — AMLODIPINE BESYLATE 5 MG PO TABS: 5.0000 mg | ORAL_TABLET | Freq: Every day | ORAL | 1 refills | Status: DC

## 2018-03-22 NOTE — Telephone Encounter (Signed)
Copied from [place] [phone]. Topic: Quick Communication - See Telephone Encounter >> Mar 22, 2018  3:56 PM Mylinda Latina, NT wrote: CRM for notification. See Telephone encounter for: 03/22/18. Charlies Silvers calling from [facility] is needing authorization for the medication amLODipine (NORVASC) 5 MG tablet . Please call to provide authorization CB# [phone]

## 2018-03-22 NOTE — Telephone Encounter (Signed)
Rx sent 

## 2018-03-22 NOTE — Telephone Encounter (Signed)
See note

## 2018-03-23 ENCOUNTER — Telehealth: Payer: Self-pay | Admitting: *Deleted

## 2018-03-23 ENCOUNTER — Other Ambulatory Visit: Payer: Self-pay | Admitting: Urology

## 2018-03-23 DIAGNOSIS — C61 Malignant neoplasm of prostate: Secondary | ICD-10-CM

## 2018-03-23 NOTE — Telephone Encounter (Signed)
Called patient to inform of sim appt. for 04-01-18, lvm for a return call

## 2018-03-24 ENCOUNTER — Ambulatory Visit (INDEPENDENT_AMBULATORY_CARE_PROVIDER_SITE_OTHER): Payer: Medicare Other | Admitting: Internal Medicine

## 2018-03-24 ENCOUNTER — Encounter: Payer: Self-pay | Admitting: *Deleted

## 2018-03-24 ENCOUNTER — Encounter: Payer: Self-pay | Admitting: Internal Medicine

## 2018-03-24 ENCOUNTER — Ambulatory Visit (INDEPENDENT_AMBULATORY_CARE_PROVIDER_SITE_OTHER): Payer: Medicare Other | Admitting: *Deleted

## 2018-03-24 VITALS — BP 140/78 | HR 69 | Ht 73.0 in | Wt 200.6 lb

## 2018-03-24 DIAGNOSIS — E538 Deficiency of other specified B group vitamins: Secondary | ICD-10-CM

## 2018-03-24 DIAGNOSIS — I10 Essential (primary) hypertension: Secondary | ICD-10-CM | POA: Diagnosis not present

## 2018-03-24 DIAGNOSIS — E785 Hyperlipidemia, unspecified: Secondary | ICD-10-CM | POA: Diagnosis not present

## 2018-03-24 DIAGNOSIS — R739 Hyperglycemia, unspecified: Secondary | ICD-10-CM

## 2018-03-24 DIAGNOSIS — Z0000 Encounter for general adult medical examination without abnormal findings: Secondary | ICD-10-CM

## 2018-03-24 LAB — COMPREHENSIVE METABOLIC PANEL
ALBUMIN: 4.1 g/dL (ref 3.5–5.2)
ALT: 21 U/L (ref 0–53)
AST: 18 U/L (ref 0–37)
Alkaline Phosphatase: 91 U/L (ref 39–117)
BUN: 14 mg/dL (ref 6–23)
CHLORIDE: 104 meq/L (ref 96–112)
CO2: 33 meq/L — AB (ref 19–32)
Calcium: 9.6 mg/dL (ref 8.4–10.5)
Creatinine, Ser: 1.18 mg/dL (ref 0.40–1.50)
GFR: 78.2 mL/min (ref >60.00)
Glucose, Bld: 97 mg/dL (ref 70–99)
POTASSIUM: 4.5 meq/L (ref 3.5–5.1)
Sodium: 141 mEq/L (ref 135–145)
Total Bilirubin: 0.8 mg/dL (ref 0.2–1.2)
Total Protein: 6.7 g/dL (ref 6.0–8.3)

## 2018-03-24 LAB — CBC WITH DIFFERENTIAL/PLATELET
BASOS PCT: 0.8 % (ref 0.0–3.0)
Basophils Absolute: 0 10*3/uL (ref 0.0–0.1)
Eosinophils Absolute: 0 10*3/uL (ref 0.0–0.7)
Eosinophils Relative: 1.1 % (ref 0.0–5.0)
HEMATOCRIT: 39.8 % (ref 39.0–52.0)
Hemoglobin: 13.3 g/dL (ref 13.0–17.0)
LYMPHS ABS: 0.8 10*3/uL (ref 0.7–4.0)
Lymphocytes Relative: 24.9 % (ref 12.0–46.0)
MCHC: 33.5 g/dL (ref 30.0–36.0)
MCV: 85.1 fl (ref 78.0–100.0)
MONO ABS: 0.3 10*3/uL (ref 0.1–1.0)
Monocytes Relative: 10.2 % (ref 3.0–12.0)
NEUTROS ABS: 2.1 10*3/uL (ref 1.4–7.7)
Neutrophils Relative %: 63 % (ref 43.0–77.0)
PLATELETS: 236 10*3/uL (ref 150.0–400.0)
RBC: 4.68 Mil/uL (ref 4.22–5.81)
RDW: 13.8 % (ref 11.5–15.5)
WBC: 3.3 10*3/uL — ABNORMAL LOW (ref 4.0–10.5)

## 2018-03-24 LAB — LIPID PANEL
CHOL/HDL RATIO: 4
Cholesterol: 176 mg/dL (ref 0–200)
HDL: 44.9 mg/dL (ref >39.00)
LDL CALC: 109 mg/dL — AB (ref 0–99)
NonHDL: 130.87
TRIGLYCERIDES: 110 mg/dL (ref 0.0–149.0)
VLDL: 22 mg/dL (ref 0.0–40.0)

## 2018-03-24 LAB — VITAMIN B12: Vitamin B-12: 177 pg/mL — ABNORMAL LOW (ref 211–911)

## 2018-03-24 LAB — HEMOGLOBIN A1C: Hgb A1c MFr Bld: 6.1 % (ref 4.6–6.5)

## 2018-03-24 LAB — TSH: TSH: 2 u[IU]/mL (ref 0.35–4.50)

## 2018-03-24 NOTE — Patient Instructions (Signed)
Please schedule your next medicare wellness visit with me in 1 yr.  Eat heart healthy diet (full of fruits, vegetables, whole grains, lean protein, water--limit salt, fat, and sugar intake) and increase physical activity as tolerated.  Continue doing brain stimulating activities (puzzles, reading, adult coloring books, staying active) to keep memory sharp.   Bring a copy of your living will and/or healthcare power of attorney to your next office visit.   Mr. Dennis Huff , Thank you for taking time to come for your Medicare Wellness Visit. I appreciate your ongoing commitment to your health goals. Please review the following plan we discussed and let me know if I can assist you in the future.   These are the goals we discussed: Goals    . Increase physical activity       This is a list of the screening recommended for you and due dates:  Health Maintenance  Topic Date Due  . Pneumonia vaccines (2 of 2 - PPSV23) 03/20/2019*  . Flu Shot  04/07/2018  . Tetanus Vaccine  03/23/2027  . Colon Cancer Screening  12/02/2027  .  Hepatitis C: One time screening is recommended by Center for Disease Control  (CDC) for  adults born from [birth year] through 1964.   Completed  *Topic was postponed. The date shown is not the original due date.    Health Maintenance, Male A healthy lifestyle and preventive care is important for your health and wellness. Ask your health care provider about what schedule of regular examinations is right for you. What should I know about weight and diet? Eat a Healthy Diet  Eat plenty of vegetables, fruits, whole grains, low-fat dairy products, and lean protein.  Do not eat a lot of foods high in solid fats, added sugars, or salt.  Maintain a Healthy Weight Regular exercise can help you achieve or maintain a healthy weight. You should:  Do at least 150 minutes of exercise each week. The exercise should increase your heart rate and make you sweat (moderate-intensity  exercise).  Do strength-training exercises at least twice a week.  Watch Your Levels of Cholesterol and Blood Lipids  Have your blood tested for lipids and cholesterol every 5 years starting at 71 years of age. If you are at high risk for heart disease, you should start having your blood tested when you are 71 years old. You may need to have your cholesterol levels checked more often if: ? Your lipid or cholesterol levels are high. ? You are older than 71 years of age. ? You are at high risk for heart disease.  What should I know about cancer screening? Many types of cancers can be detected early and may often be prevented. Lung Cancer  You should be screened every year for lung cancer if: ? You are a current smoker who has smoked for at least 30 years. ? You are a former smoker who has quit within the past 15 years.  Talk to your health care provider about your screening options, when you should start screening, and how often you should be screened.  Colorectal Cancer  Routine colorectal cancer screening usually begins at 71 years of age and should be repeated every 5-10 years until you are 71 years old. You may need to be screened more often if early forms of precancerous polyps or small growths are found. Your health care provider may recommend screening at an earlier age if you have risk factors for colon cancer.  Your health care provider  may recommend using home test kits to check for hidden blood in the stool.  A small camera at the end of a tube can be used to examine your colon (sigmoidoscopy or colonoscopy). This checks for the earliest forms of colorectal cancer.  Prostate and Testicular Cancer  Depending on your age and overall health, your health care provider may do certain tests to screen for prostate and testicular cancer.  Talk to your health care provider about any symptoms or concerns you have about testicular or prostate cancer.  Skin Cancer  Check your skin  from head to toe regularly.  Tell your health care provider about any new moles or changes in moles, especially if: ? There is a change in a mole's size, shape, or color. ? You have a mole that is larger than a pencil eraser.  Always use sunscreen. Apply sunscreen liberally and repeat throughout the day.  Protect yourself by wearing long sleeves, pants, a wide-brimmed hat, and sunglasses when outside.  What should I know about heart disease, diabetes, and high blood pressure?  If you are 55-78 years of age, have your blood pressure checked every 3-5 years. If you are 64 years of age or older, have your blood pressure checked every year. You should have your blood pressure measured twice-once when you are at a hospital or clinic, and once when you are not at a hospital or clinic. Record the average of the two measurements. To check your blood pressure when you are not at a hospital or clinic, you can use: ? An automated blood pressure machine at a pharmacy. ? A home blood pressure monitor.  Talk to your health care provider about your target blood pressure.  If you are between 67-75 years old, ask your health care provider if you should take aspirin to prevent heart disease.  Have regular diabetes screenings by checking your fasting blood sugar level. ? If you are at a normal weight and have a low risk for diabetes, have this test once every three years after the age of 27. ? If you are overweight and have a high risk for diabetes, consider being tested at a younger age or more often.  A one-time screening for abdominal aortic aneurysm (AAA) by ultrasound is recommended for men aged 78-75 years who are current or former smokers. What should I know about preventing infection? Hepatitis B If you have a higher risk for hepatitis B, you should be screened for this virus. Talk with your health care provider to find out if you are at risk for hepatitis B infection. Hepatitis C Blood testing is  recommended for:  Everyone born from [birth year] through 1964.  Anyone with known risk factors for hepatitis C.  Sexually Transmitted Diseases (STDs)  You should be screened each year for STDs including gonorrhea and chlamydia if: ? You are sexually active and are younger than 71 years of age. ? You are older than 71 years of age and your health care provider tells you that you are at risk for this type of infection. ? Your sexual activity has changed since you were last screened and you are at an increased risk for chlamydia or gonorrhea. Ask your health care provider if you are at risk.  Talk with your health care provider about whether you are at high risk of being infected with HIV. Your health care provider may recommend a prescription medicine to help prevent HIV infection.  What else can I do?  Schedule regular health,  dental, and eye exams.  Stay current with your vaccines (immunizations).  Do not use any tobacco products, such as cigarettes, chewing tobacco, and e-cigarettes. If you need help quitting, ask your health care provider.  Limit alcohol intake to no more than 2 drinks per day. One drink equals 12 ounces of beer, 5 ounces of wine, or 1 ounces of hard liquor.  Do not use street drugs.  Do not share needles.  Ask your health care provider for help if you need support or information about quitting drugs.  Tell your health care provider if you often feel depressed.  Tell your health care provider if you have ever been abused or do not feel safe at home. This information is not intended to replace advice given to you by your health care provider. Make sure you discuss any questions you have with your health care provider. Document Released: 02/20/2008 Document Revised: 04/22/2016 Document Reviewed: 05/28/2015 Elsevier Interactive Patient Education  [employer].

## 2018-03-24 NOTE — Progress Notes (Signed)
Subjective:    Patient ID: Dennis Huff, male    DOB: 05-16-1946, 72 y.o.   MRN: [id number]  DOS:  03/24/2018 Type of visit - description : CPX Interval history: No major concerns   Review of Systems Good compliance with BP meds, no edema History of prostate cancer, to start treatment soon, emotionally doing well, no GU symptoms  Other than above, a 14 point review of systems is negative     Past Medical History:  Diagnosis Date  . Allergy   . B12 deficiency    mild   . 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 Elevated PSA 10/2016   planned prostate bx  . Hyperlipidemia   . Hypertension   . Normal cardiac stress test 11-2010    done for an abnormal EKG  . Prostate cancer ([place]) 11/2017    Past Surgical History:  Procedure Laterality Date  . PROSTATE BIOPSY    . WISDOM TOOTH EXTRACTION      Social History   Socioeconomic History  . Marital status: Married    Spouse name: Not on file  . Number of children: 2  . Years of education: Not on file  . Highest education level: Not on file  Occupational History  . Occupation: retired -- Engineer, building services: [employer]  . Financial resource strain: Not on file  . Food insecurity:    Worry: Not on file    Inability: Not on file  . Transportation needs:    Medical: Not on file    Non-medical: Not on file  Tobacco Use  . Smoking status: Never Smoker  . Smokeless tobacco: Never Used  Substance and Sexual Activity  . Alcohol use: No    Frequency: Never  . Drug use: No  . Sexual activity: Not Currently  Lifestyle  . Physical activity:    Days per week: Not on file    Minutes per session: Not on file  . Stress: Not on file  Relationships  . Social connections:    Talks on phone: Not on file    Gets together: Not on file    Attends religious service: Not on file    Active member of club or organization: Not on file    Attends meetings  of clubs or organizations: Not on file    Relationship status: Not on file  . Intimate partner violence:    Fear of current or ex partner: Not on file    Emotionally abused: Not on file    Physically abused: Not on file    Forced sexual activity: Not on file  Other Topics Concern  . Not on file  Social History Narrative   Lives w/ wife, lost mother in law ~ 2017   2 adult and independent children     Family History  Problem Relation Age of Onset  . Diabetes Mother   . Coronary artery disease Other        GF---> MI at around 71 y/o  . Colon cancer Neg Hx   . Stroke Neg Hx   . Esophageal cancer Neg Hx   . Liver cancer Neg Hx   . Pancreatic cancer Neg Hx   . Rectal cancer Neg Hx   . Stomach cancer Neg Hx   . Prostate cancer Neg Hx      Allergies as of 03/24/2018   No Known  Allergies     Medication List        Accurate as of 03/24/18  5:47 PM. Always use your most recent med list.          amLODipine 5 MG tablet Commonly known as:  NORVASC Take 1 tablet (5 mg total) by mouth daily.   aspirin 81 MG tablet Take 81 mg by mouth daily.   multivitamin tablet Take 1 tablet by mouth daily.   rosuvastatin 5 MG tablet Commonly known as:  CRESTOR Take 1 tablet (5 mg total) by mouth daily.          Objective:   Physical Exam BP 140/78 (BP Location: Left Arm, Patient Position: Sitting, Cuff Size: Normal)   Pulse 69   Ht 6\' 1"  (1.854 m)   Wt 200 lb 9 oz (91 kg)   SpO2 97%   BMI 26.46 kg/m  General: Well developed, NAD, see BMI.  Neck: No  thyromegaly  HEENT:  Normocephalic . Face symmetric, atraumatic Lungs:  CTA B Normal respiratory effort, no intercostal retractions, no accessory muscle use. Heart: RRR,  no murmur.  No pretibial edema bilaterally  Abdomen:  Not distended, soft, non-tender. No rebound or rigidity.   Skin: Exposed areas without rash. Not pale. Not jaundice Neurologic:  alert & oriented X3.  Speech normal, gait appropriate for age and  unassisted Strength symmetric and appropriate for age.  Psych: Cognition and judgment appear intact.  Cooperative with normal attention span and concentration.  Behavior appropriate. No anxious or depressed appearing.      Assessment HTN started meds 02/2018 Hyperglycemia, A1c 5.9  2010 Hyperlipidemia: Intolerant to Lipitor, Pravachol Prostate cancer: DX 11-2017, patient elected to external XRT MSK: on prn meloxicam Mild B12 deficiency-- oral supplements  Abnormal EKG, abnormal stress test 11-2010 Cytopenia, hematology evaluation 2011, Rx observation  Plan: HTN: No recent ambulatory BPs, BP today 140, continue amlodipine, monitor BPs. Hyperglycemia: Checking A1c Hyperlipidemia: Reports good compliance with Crestor, checking labs, refill with results Prostate cancer: Took start external XRT soon. B12 deficiency: Not on supplements.  Labs RTC 1 year

## 2018-03-24 NOTE — Assessment & Plan Note (Signed)
HTN: No recent ambulatory BPs, BP today 140, continue amlodipine, monitor BPs. Hyperglycemia: Checking A1c Hyperlipidemia: Reports good compliance with Crestor, checking labs, refill with results Prostate cancer: Took start external XRT soon. B12 deficiency: Not on supplements.  Labs RTC 1 year

## 2018-03-24 NOTE — Patient Instructions (Signed)
GO TO THE LAB : Get the blood work     GO TO THE FRONT DESK Schedule your next appointment for a physical exam in 1 year   Check the  blood pressure  monthly   Be sure your blood pressure is between 110/65 and  135/85. If it is consistently higher or lower, let me know    

## 2018-03-24 NOTE — Assessment & Plan Note (Signed)
-  Td 03/22/2017;  pnm 23: 2011; prevnar 01-2014; zostavax 2012; shingrex rx printed last year, did not proceed -CCS: s/p several Cscopes per pt , last Cscope 04-2007 normal (Dr Johna Roles), Cscope again 11/2017, 10 years per GI letter   - Prostate ca: per urology, to start external XRT soon -Diet and exercise discussed -Labs: CMP, FLP, CBC, A1c, TSH

## 2018-03-24 NOTE — Progress Notes (Signed)
Pre visit review using our clinic review tool, if applicable. No additional management support is needed unless otherwise documented below in the visit note. 

## 2018-03-31 ENCOUNTER — Telehealth: Payer: Self-pay | Admitting: *Deleted

## 2018-03-31 ENCOUNTER — Encounter: Payer: Self-pay | Admitting: Urology

## 2018-03-31 NOTE — Progress Notes (Signed)
Patient had fiducial markers and SpaceOAR gel placement with Dr. Gloriann Loan in office at [facility] on 03/22/18.  Scheduled for CT [facility] 04/01/18 with prostate MRI to follow.   Nicholos Johns, PA-C

## 2018-03-31 NOTE — Telephone Encounter (Signed)
Called patient to remind of sim appt.for 04-01-18 and his MRI for 04-01-18, lvm for a return call

## 2018-04-01 ENCOUNTER — Ambulatory Visit
Admission: RE | Admit: 2018-04-01 | Discharge: 2018-04-01 | Disposition: A | Discharge status: Home / Self Care | Payer: Medicare Other | Source: Ambulatory Visit | Attending: Radiation Oncology | Admitting: Radiation Oncology

## 2018-04-01 ENCOUNTER — Ambulatory Visit (HOSPITAL_COMMUNITY)
Admission: RE | Admit: 2018-04-01 | Discharge: 2018-04-01 | Disposition: A | Discharge status: Home / Self Care | Payer: Medicare Other | Source: Ambulatory Visit | Attending: Urology | Admitting: Urology

## 2018-04-01 ENCOUNTER — Encounter: Payer: Self-pay | Admitting: Medical Oncology

## 2018-04-01 DIAGNOSIS — C61 Malignant neoplasm of prostate: Secondary | ICD-10-CM

## 2018-04-01 NOTE — Progress Notes (Signed)
  Radiation Oncology         ([phone]) [phone] ________________________________  Name: Dennis Huff MRN: [id number]  Date: 04/01/2018  DOB: 05/05/1946  SIMULATION AND TREATMENT PLANNING NOTE    ICD-10-CM   1. Prostate cancer [facility]) C61     DIAGNOSIS:  72 y.o. gentleman with Stage T1c adenocarcinoma of the prostate with Gleason Score of 4+3, and PSA of 5.49  NARRATIVE:  The patient was brought to the [facility].  Identity was confirmed.  All relevant records and images related to the planned course of therapy were reviewed.  The patient freely provided informed written consent to proceed with treatment after reviewing the details related to the planned course of therapy. The consent form was witnessed and verified by the simulation staff.  Then, the patient was set-up in a stable reproducible supine position for radiation therapy.  A vacuum lock pillow device was custom fabricated to position his legs in a reproducible immobilized position.  Then, I performed a urethrogram under sterile conditions to identify the prostatic apex.  CT images were obtained.  Surface markings were placed.  The CT images were loaded into the planning software.  Then the prostate target and avoidance structures including the rectum, bladder, bowel and hips were contoured.  Treatment planning then occurred.  The radiation prescription was entered and confirmed.  A total of one complex treatment devices was fabricated. I have requested : Intensity Modulated Radiotherapy (IMRT) is medically necessary for this case for the following reason:  Rectal sparing.Marland Kitchen  PLAN:  The patient will receive 70 Gy in 28 fractions.  ________________________________  Sheral Apley Tammi Klippel, M.D.

## 2018-04-07 DIAGNOSIS — Z510 Encounter for antineoplastic radiation therapy: Secondary | ICD-10-CM | POA: Insufficient documentation

## 2018-04-07 DIAGNOSIS — C61 Malignant neoplasm of prostate: Secondary | ICD-10-CM | POA: Insufficient documentation

## 2018-04-12 ENCOUNTER — Ambulatory Visit
Admission: RE | Admit: 2018-04-12 | Discharge: 2018-04-12 | Disposition: A | Discharge status: Home / Self Care | Payer: Medicare Other | Source: Ambulatory Visit | Attending: Radiation Oncology | Admitting: Radiation Oncology

## 2018-04-12 ENCOUNTER — Encounter: Payer: Self-pay | Admitting: Medical Oncology

## 2018-04-12 DIAGNOSIS — Z510 Encounter for antineoplastic radiation therapy: Secondary | ICD-10-CM | POA: Diagnosis not present

## 2018-04-13 ENCOUNTER — Ambulatory Visit
Admission: RE | Admit: 2018-04-13 | Discharge: 2018-04-13 | Disposition: A | Discharge status: Home / Self Care | Payer: Medicare Other | Source: Ambulatory Visit | Attending: Radiation Oncology | Admitting: Radiation Oncology

## 2018-04-13 DIAGNOSIS — Z510 Encounter for antineoplastic radiation therapy: Secondary | ICD-10-CM | POA: Diagnosis not present

## 2018-04-14 ENCOUNTER — Ambulatory Visit
Admission: RE | Admit: 2018-04-14 | Discharge: 2018-04-14 | Disposition: A | Discharge status: Home / Self Care | Payer: Medicare Other | Source: Ambulatory Visit | Attending: Radiation Oncology | Admitting: Radiation Oncology

## 2018-04-14 DIAGNOSIS — Z510 Encounter for antineoplastic radiation therapy: Secondary | ICD-10-CM | POA: Diagnosis not present

## 2018-04-15 ENCOUNTER — Ambulatory Visit
Admission: RE | Admit: 2018-04-15 | Discharge: 2018-04-15 | Disposition: A | Discharge status: Home / Self Care | Payer: Medicare Other | Source: Ambulatory Visit | Attending: Radiation Oncology | Admitting: Radiation Oncology

## 2018-04-15 DIAGNOSIS — Z510 Encounter for antineoplastic radiation therapy: Secondary | ICD-10-CM | POA: Diagnosis not present

## 2018-04-18 ENCOUNTER — Ambulatory Visit
Admission: RE | Admit: 2018-04-18 | Discharge: 2018-04-18 | Disposition: A | Discharge status: Home / Self Care | Payer: Medicare Other | Source: Ambulatory Visit | Attending: Radiation Oncology | Admitting: Radiation Oncology

## 2018-04-18 DIAGNOSIS — Z510 Encounter for antineoplastic radiation therapy: Secondary | ICD-10-CM | POA: Diagnosis not present

## 2018-04-19 ENCOUNTER — Ambulatory Visit
Admission: RE | Admit: 2018-04-19 | Discharge: 2018-04-19 | Disposition: A | Discharge status: Home / Self Care | Payer: Medicare Other | Source: Ambulatory Visit | Attending: Radiation Oncology | Admitting: Radiation Oncology

## 2018-04-19 DIAGNOSIS — Z510 Encounter for antineoplastic radiation therapy: Secondary | ICD-10-CM | POA: Diagnosis not present

## 2018-04-20 ENCOUNTER — Ambulatory Visit
Admission: RE | Admit: 2018-04-20 | Discharge: 2018-04-20 | Disposition: A | Discharge status: Home / Self Care | Payer: Medicare Other | Source: Ambulatory Visit | Attending: Radiation Oncology | Admitting: Radiation Oncology

## 2018-04-20 DIAGNOSIS — Z510 Encounter for antineoplastic radiation therapy: Secondary | ICD-10-CM | POA: Diagnosis not present

## 2018-04-21 ENCOUNTER — Ambulatory Visit
Admission: RE | Admit: 2018-04-21 | Discharge: 2018-04-21 | Disposition: A | Discharge status: Home / Self Care | Payer: Medicare Other | Source: Ambulatory Visit | Attending: Radiation Oncology | Admitting: Radiation Oncology

## 2018-04-21 ENCOUNTER — Other Ambulatory Visit: Payer: Self-pay | Admitting: Internal Medicine

## 2018-04-21 DIAGNOSIS — Z510 Encounter for antineoplastic radiation therapy: Secondary | ICD-10-CM | POA: Diagnosis not present

## 2018-04-21 MED ORDER — ROSUVASTATIN CALCIUM 5 MG PO TABS: 5.0000 mg | ORAL_TABLET | Freq: Every day | ORAL | 3 refills | Status: DC

## 2018-04-21 NOTE — Telephone Encounter (Signed)
Copied from [place] [id number]. Topic: Quick Communication - Rx Refill/Question >> Apr 21, 2018  3:36 PM Tye [place] wrote: Medication: rosuvastatin (CRESTOR) 5 MG tablet [[id number]]   Has the patient contacted their pharmacy? Yes.   (Agent: If no, request that the patient contact the pharmacy for the refill.) (Agent: If yes, when and what did the pharmacy advise?)  Preferred Pharmacy (with phone number or street name): [facility]  Agent: Please be advised that RX refills may take up to 3 business days. We ask that you follow-up with your pharmacy.

## 2018-04-21 NOTE — Telephone Encounter (Signed)
Rx sent 

## 2018-04-22 ENCOUNTER — Ambulatory Visit
Admission: RE | Admit: 2018-04-22 | Discharge: 2018-04-22 | Disposition: A | Discharge status: Home / Self Care | Payer: Medicare Other | Source: Ambulatory Visit | Attending: Radiation Oncology | Admitting: Radiation Oncology

## 2018-04-22 DIAGNOSIS — Z510 Encounter for antineoplastic radiation therapy: Secondary | ICD-10-CM | POA: Diagnosis not present

## 2018-04-24 ENCOUNTER — Ambulatory Visit: Admission: RE | Admit: 2018-04-24 | Payer: Medicare Other | Source: Ambulatory Visit

## 2018-04-25 ENCOUNTER — Ambulatory Visit
Admission: RE | Admit: 2018-04-25 | Discharge: 2018-04-25 | Disposition: A | Discharge status: Home / Self Care | Payer: Medicare Other | Source: Ambulatory Visit | Attending: Radiation Oncology | Admitting: Radiation Oncology

## 2018-04-25 DIAGNOSIS — Z510 Encounter for antineoplastic radiation therapy: Secondary | ICD-10-CM | POA: Diagnosis not present

## 2018-04-26 ENCOUNTER — Ambulatory Visit
Admission: RE | Admit: 2018-04-26 | Discharge: 2018-04-26 | Disposition: A | Discharge status: Home / Self Care | Payer: Medicare Other | Source: Ambulatory Visit | Attending: Radiation Oncology | Admitting: Radiation Oncology

## 2018-04-26 ENCOUNTER — Encounter: Payer: Self-pay | Admitting: Medical Oncology

## 2018-04-26 DIAGNOSIS — Z510 Encounter for antineoplastic radiation therapy: Secondary | ICD-10-CM | POA: Diagnosis not present

## 2018-04-27 ENCOUNTER — Ambulatory Visit
Admission: RE | Admit: 2018-04-27 | Discharge: 2018-04-27 | Disposition: A | Discharge status: Home / Self Care | Payer: Medicare Other | Source: Ambulatory Visit | Attending: Radiation Oncology | Admitting: Radiation Oncology

## 2018-04-27 DIAGNOSIS — Z510 Encounter for antineoplastic radiation therapy: Secondary | ICD-10-CM | POA: Diagnosis not present

## 2018-04-28 ENCOUNTER — Ambulatory Visit
Admission: RE | Admit: 2018-04-28 | Discharge: 2018-04-28 | Disposition: A | Discharge status: Home / Self Care | Payer: Medicare Other | Source: Ambulatory Visit | Attending: Radiation Oncology | Admitting: Radiation Oncology

## 2018-04-28 DIAGNOSIS — Z510 Encounter for antineoplastic radiation therapy: Secondary | ICD-10-CM | POA: Diagnosis not present

## 2018-04-29 ENCOUNTER — Ambulatory Visit
Admission: RE | Admit: 2018-04-29 | Discharge: 2018-04-29 | Disposition: A | Discharge status: Home / Self Care | Payer: Medicare Other | Source: Ambulatory Visit | Attending: Radiation Oncology | Admitting: Radiation Oncology

## 2018-04-29 DIAGNOSIS — Z510 Encounter for antineoplastic radiation therapy: Secondary | ICD-10-CM | POA: Diagnosis not present

## 2018-05-02 ENCOUNTER — Ambulatory Visit
Admission: RE | Admit: 2018-05-02 | Discharge: 2018-05-02 | Disposition: A | Discharge status: Home / Self Care | Payer: Medicare Other | Source: Ambulatory Visit | Attending: Radiation Oncology | Admitting: Radiation Oncology

## 2018-05-02 DIAGNOSIS — Z510 Encounter for antineoplastic radiation therapy: Secondary | ICD-10-CM | POA: Diagnosis not present

## 2018-05-03 ENCOUNTER — Ambulatory Visit
Admission: RE | Admit: 2018-05-03 | Discharge: 2018-05-03 | Disposition: A | Discharge status: Home / Self Care | Payer: Medicare Other | Source: Ambulatory Visit | Attending: Radiation Oncology | Admitting: Radiation Oncology

## 2018-05-03 DIAGNOSIS — Z510 Encounter for antineoplastic radiation therapy: Secondary | ICD-10-CM | POA: Diagnosis not present

## 2018-05-04 ENCOUNTER — Ambulatory Visit
Admission: RE | Admit: 2018-05-04 | Discharge: 2018-05-04 | Disposition: A | Discharge status: Home / Self Care | Payer: Medicare Other | Source: Ambulatory Visit | Attending: Radiation Oncology | Admitting: Radiation Oncology

## 2018-05-04 DIAGNOSIS — Z510 Encounter for antineoplastic radiation therapy: Secondary | ICD-10-CM | POA: Diagnosis not present

## 2018-05-05 ENCOUNTER — Ambulatory Visit
Admission: RE | Admit: 2018-05-05 | Discharge: 2018-05-05 | Disposition: A | Discharge status: Home / Self Care | Payer: Medicare Other | Source: Ambulatory Visit | Attending: Radiation Oncology | Admitting: Radiation Oncology

## 2018-05-05 DIAGNOSIS — Z510 Encounter for antineoplastic radiation therapy: Secondary | ICD-10-CM | POA: Diagnosis not present

## 2018-05-06 ENCOUNTER — Ambulatory Visit
Admission: RE | Admit: 2018-05-06 | Discharge: 2018-05-06 | Disposition: A | Discharge status: Home / Self Care | Payer: Medicare Other | Source: Ambulatory Visit | Attending: Radiation Oncology | Admitting: Radiation Oncology

## 2018-05-06 DIAGNOSIS — Z510 Encounter for antineoplastic radiation therapy: Secondary | ICD-10-CM | POA: Diagnosis not present

## 2018-05-10 ENCOUNTER — Ambulatory Visit
Admission: RE | Admit: 2018-05-10 | Discharge: 2018-05-10 | Disposition: A | Discharge status: Home / Self Care | Payer: Medicare Other | Source: Ambulatory Visit | Attending: Radiation Oncology | Admitting: Radiation Oncology

## 2018-05-10 DIAGNOSIS — C61 Malignant neoplasm of prostate: Secondary | ICD-10-CM | POA: Insufficient documentation

## 2018-05-11 ENCOUNTER — Ambulatory Visit
Admission: RE | Admit: 2018-05-11 | Discharge: 2018-05-11 | Disposition: A | Discharge status: Home / Self Care | Payer: Medicare Other | Source: Ambulatory Visit | Attending: Radiation Oncology | Admitting: Radiation Oncology

## 2018-05-11 DIAGNOSIS — C61 Malignant neoplasm of prostate: Secondary | ICD-10-CM | POA: Diagnosis not present

## 2018-05-12 ENCOUNTER — Ambulatory Visit
Admission: RE | Admit: 2018-05-12 | Discharge: 2018-05-12 | Disposition: A | Discharge status: Home / Self Care | Payer: Medicare Other | Source: Ambulatory Visit | Attending: Radiation Oncology | Admitting: Radiation Oncology

## 2018-05-12 DIAGNOSIS — C61 Malignant neoplasm of prostate: Secondary | ICD-10-CM | POA: Diagnosis not present

## 2018-05-13 ENCOUNTER — Ambulatory Visit
Admission: RE | Admit: 2018-05-13 | Discharge: 2018-05-13 | Disposition: A | Discharge status: Home / Self Care | Payer: Medicare Other | Source: Ambulatory Visit | Attending: Radiation Oncology | Admitting: Radiation Oncology

## 2018-05-13 DIAGNOSIS — C61 Malignant neoplasm of prostate: Secondary | ICD-10-CM | POA: Diagnosis not present

## 2018-05-16 ENCOUNTER — Ambulatory Visit
Admission: RE | Admit: 2018-05-16 | Discharge: 2018-05-16 | Disposition: A | Discharge status: Home / Self Care | Payer: Medicare Other | Source: Ambulatory Visit | Attending: Radiation Oncology | Admitting: Radiation Oncology

## 2018-05-16 DIAGNOSIS — C61 Malignant neoplasm of prostate: Secondary | ICD-10-CM | POA: Diagnosis not present

## 2018-05-17 ENCOUNTER — Ambulatory Visit
Admission: RE | Admit: 2018-05-17 | Discharge: 2018-05-17 | Disposition: A | Discharge status: Home / Self Care | Payer: Medicare Other | Source: Ambulatory Visit | Attending: Radiation Oncology | Admitting: Radiation Oncology

## 2018-05-17 DIAGNOSIS — C61 Malignant neoplasm of prostate: Secondary | ICD-10-CM | POA: Diagnosis not present

## 2018-05-18 ENCOUNTER — Ambulatory Visit
Admission: RE | Admit: 2018-05-18 | Discharge: 2018-05-18 | Disposition: A | Discharge status: Home / Self Care | Payer: Medicare Other | Source: Ambulatory Visit | Attending: Radiation Oncology | Admitting: Radiation Oncology

## 2018-05-18 DIAGNOSIS — C61 Malignant neoplasm of prostate: Secondary | ICD-10-CM | POA: Diagnosis not present

## 2018-05-19 ENCOUNTER — Ambulatory Visit
Admission: RE | Admit: 2018-05-19 | Discharge: 2018-05-19 | Disposition: A | Discharge status: Home / Self Care | Payer: Medicare Other | Source: Ambulatory Visit | Attending: Radiation Oncology | Admitting: Radiation Oncology

## 2018-05-19 DIAGNOSIS — C61 Malignant neoplasm of prostate: Secondary | ICD-10-CM | POA: Diagnosis not present

## 2018-05-20 ENCOUNTER — Ambulatory Visit
Admission: RE | Admit: 2018-05-20 | Discharge: 2018-05-20 | Disposition: A | Discharge status: Home / Self Care | Payer: Medicare Other | Source: Ambulatory Visit | Attending: Radiation Oncology | Admitting: Radiation Oncology

## 2018-05-20 ENCOUNTER — Encounter: Payer: Self-pay | Admitting: Radiation Oncology

## 2018-05-20 DIAGNOSIS — C61 Malignant neoplasm of prostate: Secondary | ICD-10-CM | POA: Diagnosis not present

## 2018-05-26 NOTE — Progress Notes (Signed)
°  Radiation Oncology         [phone]) [phone] ________________________________  Name: Dennis Huff MRN: [id number]  Date: 05/20/2018  DOB: 27-Jun-1946  End of Treatment Note  Diagnosis:   72 y.o. male with Stage T1c adenocarcinoma of the prostate with Gleason Score of 4+3, and PSA of 5.49    Indication for treatment:  Curative, Definitive Radiotherapy       Radiation treatment dates:   04/12/2018 - 05/20/2018  Site/dose:   The prostate was treated to 70 Gy in 28 fractions of 2.5 Gy  Beams/energy:   The patient was treated with IMRT using volumetric arc therapy delivering 6 MV X-rays to clockwise and counterclockwise circumferential arcs with a 90 degree collimator offset to avoid dose scalloping.  Image guidance was performed with daily cone beam CT prior to each fraction to align to gold markers in the prostate and assure proper bladder and rectal fill volumes.  Immobilization was achieved with BodyFix custom mold.  Narrative: The patient tolerated radiation treatment relatively well.  He experienced modest fatigue and some minor urinary irritation with intermittency, nocturia x2, and mild dysuria. He denies hematuria. He also reported having some diarrhea.  Plan: The patient has completed radiation treatment. He will return to radiation oncology clinic for routine followup in one month. I advised him to call or return sooner if he has any questions or concerns related to his recovery or treatment. ________________________________  Sheral Apley. Tammi Klippel, M.D.  This document serves as a record of services personally performed by Tyler Pita, MD. It was created on his behalf by Rae Lips, a trained medical scribe. The creation of this record is based on the scribe's personal observations and the provider's statements to them. This document has been checked and approved by the attending provider.

## 2018-06-21 ENCOUNTER — Encounter: Payer: Self-pay | Admitting: Urology

## 2018-06-21 ENCOUNTER — Ambulatory Visit
Admission: RE | Admit: 2018-06-21 | Discharge: 2018-06-21 | Disposition: A | Discharge status: Home / Self Care | Payer: Medicare Other | Source: Ambulatory Visit | Attending: Urology | Admitting: Urology

## 2018-06-21 ENCOUNTER — Other Ambulatory Visit: Payer: Self-pay

## 2018-06-21 VITALS — BP 147/85 | HR 68 | Temp 98.3°F | Resp 20 | Ht 73.0 in | Wt 199.4 lb

## 2018-06-21 DIAGNOSIS — C61 Malignant neoplasm of prostate: Secondary | ICD-10-CM | POA: Diagnosis not present

## 2018-06-21 DIAGNOSIS — Z79899 Other long term (current) drug therapy: Secondary | ICD-10-CM | POA: Insufficient documentation

## 2018-06-21 DIAGNOSIS — Z7982 Long term (current) use of aspirin: Secondary | ICD-10-CM | POA: Diagnosis not present

## 2018-06-21 DIAGNOSIS — Z923 Personal history of irradiation: Secondary | ICD-10-CM | POA: Diagnosis not present

## 2018-06-21 NOTE — Progress Notes (Addendum)
Radiation Oncology         ([phone]) [phone] ________________________________  Name: Dennis Huff MRN: [id number]  Date: 06/21/2018  DOB: 1946/01/21  Post Treatment Note  CC: Colon Branch, MD  Nickie Retort, MD  Diagnosis:   72 y.o. male with Stage T1c adenocarcinoma of the prostate with Gleason Score of 4+3, and PSA of 5.49    Interval Since Last Radiation:  4 weeks  04/12/2018 - 05/20/2018:  The prostate was treated to 70 Gy in 28 fractions of 2.5 Gy  Narrative:  The patient returns today for routine follow-up.  He tolerated radiation treatment relatively well.  He experienced modest fatigue and some minor urinary irritation with intermittency, nocturia x2, and mild dysuria. He denies hematuria. He also reported having some diarrhea.                              On review of systems, the patient states that he is doing very well overall.  He continues with mild fatigue which is gradually improving.  He reports that his lower urinary tract symptoms continue to improve and are almost completely back to normal at this point.  His current IPSS score is 9 indicating mild to moderate urinary symptoms.  He continues with nocturia x3 but specifically denies gross hematuria, dysuria, excessive daytime frequency, urgency, weak stream, incomplete emptying or incontinence.  He reports a healthy appetite and is maintaining his weight.  He he denies abdominal pain, nausea, vomiting, diarrhea or constipation.  ALLERGIES:  has No Known Allergies.  Meds: Current Outpatient Medications  Medication Sig Dispense Refill  . amLODipine (NORVASC) 5 MG tablet Take 1 tablet (5 mg total) by mouth daily. 90 tablet 1  . aspirin 81 MG tablet Take 81 mg by mouth daily.      . Multiple Vitamin (MULTIVITAMIN) tablet Take 1 tablet by mouth daily.    . rosuvastatin (CRESTOR) 5 MG tablet Take 1 tablet (5 mg total) by mouth daily. 90 tablet 3  . vitamin B-12 (CYANOCOBALAMIN) 1000 MCG tablet Take 1,000 mcg by mouth daily.      No current facility-administered medications for this encounter.     Physical Findings:  height is 6\' 1"  (1.854 m) and weight is 199 lb 6.4 oz (90.4 kg). His oral temperature is 98.3 F (36.8 C). His blood pressure is 147/85 (abnormal) and his pulse is 68. His respiration is 20 and oxygen saturation is 100%.  Pain Assessment Pain Score: 0-No pain/10 In general this is a well appearing African-American male in no acute distress.  He's alert and oriented x4 and appropriate throughout the examination. Cardiopulmonary assessment is negative for acute distress and he exhibits normal effort.   Lab Findings: Lab Results  Component Value Date   WBC 3.3 (L) 03/24/2018   HGB 13.3 03/24/2018   HCT 39.8 03/24/2018   MCV 85.1 03/24/2018   PLT 236.0 03/24/2018     Radiographic Findings: No results found.  Impression/Plan: 1. 72 y.o. male with Stage T1c adenocarcinoma of the prostate with Gleason Score of 4+3, and PSA of 5.49.   He will continue to follow up with urology for ongoing PSA determinations and will call to schedule an appointment with Dr. Tresa Moore in Dec. 2019. He understands what to expect with regards to PSA monitoring going forward. I will look forward to following his response to treatment via correspondence with urology, and would be happy to continue to participate in  his care if clinically indicated. I talked to the patient about what to expect in the future, including his risk for erectile dysfunction and rectal bleeding. I encouraged him to call or return to the office if he has any questions regarding his previous radiation or possible radiation side effects. He was comfortable with this plan and will follow up as needed.    Nicholos Johns, PA-C

## 2018-09-26 ENCOUNTER — Other Ambulatory Visit: Payer: Self-pay | Admitting: Internal Medicine

## 2018-09-27 LAB — PSA: PSA: 0.89

## 2018-10-04 DIAGNOSIS — R3915 Urgency of urination: Secondary | ICD-10-CM | POA: Diagnosis not present

## 2019-03-14 LAB — PSA: PSA: 0.84

## 2019-03-17 ENCOUNTER — Other Ambulatory Visit: Payer: Self-pay | Admitting: Internal Medicine

## 2019-03-21 DIAGNOSIS — R3915 Urgency of urination: Secondary | ICD-10-CM | POA: Diagnosis not present

## 2019-03-28 ENCOUNTER — Other Ambulatory Visit: Payer: Self-pay

## 2019-03-28 ENCOUNTER — Ambulatory Visit (INDEPENDENT_AMBULATORY_CARE_PROVIDER_SITE_OTHER): Payer: Medicare Other | Admitting: Internal Medicine

## 2019-03-28 ENCOUNTER — Encounter: Payer: Self-pay | Admitting: Internal Medicine

## 2019-03-28 ENCOUNTER — Ambulatory Visit: Payer: Medicare Other | Admitting: *Deleted

## 2019-03-28 VITALS — BP 145/78 | HR 63 | Temp 98.5°F | Resp 16 | Ht 72.0 in | Wt 206.0 lb

## 2019-03-28 DIAGNOSIS — E785 Hyperlipidemia, unspecified: Secondary | ICD-10-CM

## 2019-03-28 DIAGNOSIS — E538 Deficiency of other specified B group vitamins: Secondary | ICD-10-CM | POA: Diagnosis not present

## 2019-03-28 DIAGNOSIS — R739 Hyperglycemia, unspecified: Secondary | ICD-10-CM

## 2019-03-28 DIAGNOSIS — Z0000 Encounter for general adult medical examination without abnormal findings: Secondary | ICD-10-CM | POA: Diagnosis not present

## 2019-03-28 DIAGNOSIS — N529 Male erectile dysfunction, unspecified: Secondary | ICD-10-CM | POA: Diagnosis not present

## 2019-03-28 DIAGNOSIS — D72819 Decreased white blood cell count, unspecified: Secondary | ICD-10-CM

## 2019-03-28 LAB — CBC WITH DIFFERENTIAL/PLATELET
Basophils Absolute: 0 10*3/uL (ref 0.0–0.1)
Basophils Relative: 1.4 % (ref 0.0–3.0)
Eosinophils Absolute: 0.1 10*3/uL (ref 0.0–0.7)
Eosinophils Relative: 2 % (ref 0.0–5.0)
HCT: 40.1 % (ref 39.0–52.0)
Hemoglobin: 13.4 g/dL (ref 13.0–17.0)
Lymphocytes Relative: 28.8 % (ref 12.0–46.0)
Lymphs Abs: 0.8 10*3/uL (ref 0.7–4.0)
MCHC: 33.4 g/dL (ref 30.0–36.0)
MCV: 85.3 fl (ref 78.0–100.0)
Monocytes Absolute: 0.3 10*3/uL (ref 0.1–1.0)
Monocytes Relative: 12.3 % — ABNORMAL HIGH (ref 3.0–12.0)
Neutro Abs: 1.5 10*3/uL (ref 1.4–7.7)
Neutrophils Relative %: 55.5 % (ref 43.0–77.0)
Platelets: 227 10*3/uL (ref 150.0–400.0)
RBC: 4.71 Mil/uL (ref 4.22–5.81)
RDW: 13.6 % (ref 11.5–15.5)
WBC: 2.7 10*3/uL — ABNORMAL LOW (ref 4.0–10.5)

## 2019-03-28 LAB — HEMOGLOBIN A1C: Hgb A1c MFr Bld: 6 % (ref 4.6–6.5)

## 2019-03-28 LAB — COMPREHENSIVE METABOLIC PANEL
ALT: 24 U/L (ref 0–53)
AST: 21 U/L (ref 0–37)
Albumin: 4.3 g/dL (ref 3.5–5.2)
Alkaline Phosphatase: 83 U/L (ref 39–117)
BUN: 14 mg/dL (ref 6–23)
CO2: 27 mEq/L (ref 19–32)
Calcium: 9.5 mg/dL (ref 8.4–10.5)
Chloride: 105 mEq/L (ref 96–112)
Creatinine, Ser: 1.14 mg/dL (ref 0.40–1.50)
GFR: 76.34 mL/min (ref >60.00)
Glucose, Bld: 91 mg/dL (ref 70–99)
Potassium: 4.2 mEq/L (ref 3.5–5.1)
Sodium: 141 mEq/L (ref 135–145)
Total Bilirubin: 0.8 mg/dL (ref 0.2–1.2)
Total Protein: 6.9 g/dL (ref 6.0–8.3)

## 2019-03-28 LAB — VITAMIN B12: Vitamin B-12: 653 pg/mL (ref 211–911)

## 2019-03-28 LAB — LIPID PANEL
Cholesterol: 157 mg/dL (ref 0–200)
HDL: 47.1 mg/dL (ref >39.00)
LDL Cholesterol: 95 mg/dL (ref 0–99)
NonHDL: 109.67
Total CHOL/HDL Ratio: 3
Triglycerides: 72 mg/dL (ref 0.0–149.0)
VLDL: 14.4 mg/dL (ref 0.0–40.0)

## 2019-03-28 MED ORDER — ROSUVASTATIN CALCIUM 5 MG PO TABS: 5.0000 mg | ORAL_TABLET | Freq: Every day | ORAL | 3 refills | Status: DC

## 2019-03-28 MED ORDER — AMLODIPINE BESYLATE 5 MG PO TABS: 5.0000 mg | ORAL_TABLET | Freq: Every day | ORAL | 3 refills | Status: DC

## 2019-03-28 NOTE — Patient Instructions (Addendum)
Please schedule Medicare Wellness with Glenard Haring.   GO TO THE LAB : Get the blood work     GO TO THE FRONT DESK Schedule your next appointment  For a physical in 1 year    Check the  blood pressure 2 or 3 times a month  BP GOAL is between 110/65 and  135/85. If it is consistently higher or lower, let me know

## 2019-03-28 NOTE — Progress Notes (Signed)
Pre visit review using our clinic review tool, if applicable. No additional management support is needed unless otherwise documented below in the visit note. 

## 2019-03-28 NOTE — Progress Notes (Signed)
Subjective:    Patient ID: Dennis Huff, male    DOB: 1945/12/03, 73 y.o.   MRN: [id number]  DOS:  03/28/2019 Type of visit - description: Complete physical exam Since the last visit, he completed radiation therapy for prostate cancer, is doing great. BP today slightly elevated, no ambulatory BPs  BP Readings from Last 3 Encounters:  03/28/19 (!) 145/78  06/21/18 (!) 147/85  03/24/18 140/78    Review of Systems  Essentially no LUTS, occasionally urinary flow is slow.  Other than above, a 14 point review of systems is negative   Past Medical History:  Diagnosis Date  . Allergy   . B12 deficiency    mild   . 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 Hyperlipidemia   . Hypertension   . Normal cardiac stress test 11-2010    done for an abnormal EKG  . Prostate cancer ([place]) 11/2017  . Prostate cancer [facility])         Past Surgical History:  Procedure Laterality Date  . PROSTATE BIOPSY    . WISDOM TOOTH EXTRACTION      Social History   Socioeconomic History  . Marital status: Married    Spouse name: Not on file  . Number of children: 2  . Years of education: Not on file  . Highest education level: Not on file  Occupational History  . 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 Financial resource strain: Not on file  . Food insecurity    Worry: Not on file    Inability: Not on file  . Transportation needs    Medical: Not on file    Non-medical: Not on file  Tobacco Use  . Smoking status: Never Smoker  . Smokeless tobacco: Never Used  Substance and Sexual Activity  . Alcohol use: No    Frequency: Never  . Drug use: No  . Sexual activity: Not Currently  Lifestyle  . Physical activity    Days per week: Not on file    Minutes per session: Not on file  . Stress: Not on file  Relationships  . Social Herbalist on phone: Not on file    Gets together: Not on  file    Attends religious service: Not on file    Active member of club or organization: Not on file    Attends meetings of clubs or organizations: Not on file    Relationship status: Not on file  . Intimate partner violence    Fear of current or ex partner: No    Emotionally abused: No    Physically abused: No    Forced sexual activity: No  Other Topics Concern  . Not on file  Social History Narrative   Lives w/ wife, lost mother in law ~ 2017   2 adult and independent children     Family History  Problem Relation Age of Onset  . Diabetes Mother   . Coronary artery disease Other        GF---> MI at around 72 y/o  . Colon cancer Neg Hx   . Stroke Neg Hx   . Esophageal cancer Neg Hx   . Liver cancer Neg Hx   . Pancreatic cancer Neg Hx   . Rectal cancer Neg Hx   . Stomach cancer Neg Hx   . Prostate cancer Neg  Hx      Allergies as of 03/28/2019   No Known Allergies     Medication List       Accurate as of March 28, 2019 11:59 PM. If you have any questions, ask your nurse or doctor.        amLODipine 5 MG tablet Commonly known as: NORVASC Take 1 tablet (5 mg total) by mouth daily.   aspirin 81 MG tablet Take 81 mg by mouth daily.   multivitamin tablet Take 1 tablet by mouth daily.   rosuvastatin 5 MG tablet Commonly known as: CRESTOR Take 1 tablet (5 mg total) by mouth daily.   vitamin B-12 1000 MCG tablet Commonly known as: CYANOCOBALAMIN Take 1,000 mcg by mouth daily.           Objective:   Physical Exam BP (!) 145/78 (BP Location: Left Arm, Patient Position: Sitting, Cuff Size: Small)   Pulse 63   Temp 98.5 F (36.9 C) (Oral)   Resp 16   Ht 6' (1.829 m)   Wt 206 lb (93.4 kg)   SpO2 97%   BMI 27.94 kg/m  General: Well developed, NAD, BMI noted Neck: No  thyromegaly  HEENT:  Normocephalic . Face symmetric, atraumatic Lungs:  CTA B Normal respiratory effort, no intercostal retractions, no accessory muscle use. Heart: RRR,  no murmur.   No pretibial edema bilaterally  Abdomen:  Not distended, soft, non-tender. No rebound or rigidity.   Skin: Exposed areas without rash. Not pale. Not jaundice Neurologic:  alert & oriented X3.  Speech normal, gait appropriate for age and unassisted Strength symmetric and appropriate for age.  Psych: Cognition and judgment appear intact.  Cooperative with normal attention span and concentration.  Behavior appropriate. No anxious or depressed appearing.     Assessment     Assessment HTN started meds 02/2018 Hyperglycemia, A1c 5.9  2010 Hyperlipidemia: Intolerant to Lipitor, Pravachol Prostate cancer: DX 11-2017, patient elected to external XRT MSK: used to be on prn meloxicam Mild B12 deficiency-- oral supplements  Abnormal EKG, abnormal stress test 11-2010 Cytopenia, hematology evaluation 2011, Rx observation  Plan: HTN: BP today in the 140s, ideal BP=  120s, 130s.  For now continue amlodipine, requested patient to monitor BPs.  See AVS.  Checking labs. Hyperglycemia: Check an A1c Hyperlipidemia: On Crestor, checking labs B12 deficiency: On oral supplements, checking labs, consider injections. RTC 1 year

## 2019-03-28 NOTE — Assessment & Plan Note (Addendum)
  HTN: BP today in the 140s, ideal BP=  120s, 130s.  For now continue amlodipine, requested patient to monitor BPs.  See AVS.  Checking labs. Hyperglycemia: Check an A1c Hyperlipidemia: On Crestor, checking labs B12 deficiency: On oral supplements, checking labs, consider injections. RTC 1 year

## 2019-03-29 NOTE — Assessment & Plan Note (Signed)
-  Td 03/22/2017 - pnm 23: 2011 - prevnar 01-2014 - zostavax 2012 - shingrex discussed , will wait till next year - flu shot recommended when available  -CCS: s/p several Cscopes per pt , last Cscope 04-2007 normal (Dr Johna Roles), Cscope again 11/2017, 10 years per GI letter   - Prostate ca: per urology, s/p  XRT   -Diet and exercise discussed -Labs:  CMP, FLP, A1c, CBC, B12

## 2019-03-30 NOTE — Addendum Note (Signed)
Addended byDamita Dunnings D on: 03/30/2019 09:43 AM   Modules accepted: Orders

## 2019-04-10 ENCOUNTER — Encounter: Payer: Self-pay | Admitting: Internal Medicine

## 2019-07-10 ENCOUNTER — Encounter: Payer: Self-pay | Admitting: Internal Medicine

## 2019-09-18 LAB — PSA: PSA: 0.57

## 2019-09-25 DIAGNOSIS — R3915 Urgency of urination: Secondary | ICD-10-CM | POA: Diagnosis not present

## 2019-10-18 ENCOUNTER — Encounter: Payer: Self-pay | Admitting: Internal Medicine

## 2020-01-03 ENCOUNTER — Telehealth: Payer: Self-pay

## 2020-01-03 NOTE — Telephone Encounter (Signed)
Spoke w/ Pt- he has not been checking his BP at home regularly but does have a way to check- recommended he check for the next week and call w/ readings in 1 week. Pt verbalized understanding.

## 2020-01-03 NOTE — Telephone Encounter (Signed)
I received a report from a visiting nurse that the blood pressure was not well controlled. Agree with continue checking BPs at home as stated.

## 2020-02-09 ENCOUNTER — Other Ambulatory Visit: Payer: Self-pay | Admitting: Internal Medicine

## 2020-03-18 LAB — PSA: PSA: 0.25

## 2020-03-25 DIAGNOSIS — R3915 Urgency of urination: Secondary | ICD-10-CM | POA: Diagnosis not present

## 2020-03-28 ENCOUNTER — Encounter: Payer: Self-pay | Admitting: Internal Medicine

## 2020-03-28 ENCOUNTER — Other Ambulatory Visit: Payer: Self-pay

## 2020-03-28 ENCOUNTER — Ambulatory Visit (INDEPENDENT_AMBULATORY_CARE_PROVIDER_SITE_OTHER): Payer: Medicare Other | Admitting: Internal Medicine

## 2020-03-28 VITALS — BP 149/74 | HR 86 | Temp 98.1°F | Resp 18 | Ht 72.0 in | Wt 207.2 lb

## 2020-03-28 DIAGNOSIS — I10 Essential (primary) hypertension: Secondary | ICD-10-CM | POA: Diagnosis not present

## 2020-03-28 DIAGNOSIS — Z23 Encounter for immunization: Secondary | ICD-10-CM | POA: Diagnosis not present

## 2020-03-28 DIAGNOSIS — R739 Hyperglycemia, unspecified: Secondary | ICD-10-CM

## 2020-03-28 DIAGNOSIS — E785 Hyperlipidemia, unspecified: Secondary | ICD-10-CM

## 2020-03-28 DIAGNOSIS — Z0000 Encounter for general adult medical examination without abnormal findings: Secondary | ICD-10-CM

## 2020-03-28 LAB — COMPREHENSIVE METABOLIC PANEL
ALT: 25 U/L (ref 0–53)
AST: 20 U/L (ref 0–37)
Albumin: 4.2 g/dL (ref 3.5–5.2)
Alkaline Phosphatase: 100 U/L (ref 39–117)
BUN: 21 mg/dL (ref 6–23)
CO2: 29 mEq/L (ref 19–32)
Calcium: 9.9 mg/dL (ref 8.4–10.5)
Chloride: 105 mEq/L (ref 96–112)
Creatinine, Ser: 1.23 mg/dL (ref 0.40–1.50)
GFR: 69.74 mL/min (ref >60.00)
Glucose, Bld: 124 mg/dL — ABNORMAL HIGH (ref 70–99)
Potassium: 4.3 mEq/L (ref 3.5–5.1)
Sodium: 140 mEq/L (ref 135–145)
Total Bilirubin: 0.6 mg/dL (ref 0.2–1.2)
Total Protein: 6.6 g/dL (ref 6.0–8.3)

## 2020-03-28 LAB — CBC WITH DIFFERENTIAL/PLATELET
Basophils Absolute: 0 10*3/uL (ref 0.0–0.1)
Basophils Relative: 0.8 % (ref 0.0–3.0)
Eosinophils Absolute: 0.1 10*3/uL (ref 0.0–0.7)
Eosinophils Relative: 1.5 % (ref 0.0–5.0)
HCT: 39.1 % (ref 39.0–52.0)
Hemoglobin: 13.1 g/dL (ref 13.0–17.0)
Lymphocytes Relative: 24.2 % (ref 12.0–46.0)
Lymphs Abs: 0.8 10*3/uL (ref 0.7–4.0)
MCHC: 33.5 g/dL (ref 30.0–36.0)
MCV: 85.1 fl (ref 78.0–100.0)
Monocytes Absolute: 0.3 10*3/uL (ref 0.1–1.0)
Monocytes Relative: 9.2 % (ref 3.0–12.0)
Neutro Abs: 2.1 10*3/uL (ref 1.4–7.7)
Neutrophils Relative %: 64.3 % (ref 43.0–77.0)
Platelets: 219 10*3/uL (ref 150.0–400.0)
RBC: 4.59 Mil/uL (ref 4.22–5.81)
RDW: 13.9 % (ref 11.5–15.5)
WBC: 3.3 10*3/uL — ABNORMAL LOW (ref 4.0–10.5)

## 2020-03-28 LAB — HEMOGLOBIN A1C: Hgb A1c MFr Bld: 6.1 % (ref 4.6–6.5)

## 2020-03-28 LAB — LIPID PANEL
Cholesterol: 140 mg/dL (ref 0–200)
HDL: 46.5 mg/dL (ref >39.00)
LDL Cholesterol: 76 mg/dL (ref 0–99)
NonHDL: 93.42
Total CHOL/HDL Ratio: 3
Triglycerides: 88 mg/dL (ref 0.0–149.0)
VLDL: 17.6 mg/dL (ref 0.0–40.0)

## 2020-03-28 MED ORDER — SHINGRIX 50 MCG/0.5ML IM SUSR: 0.5000 mL | Freq: Once | INTRAMUSCULAR | 1 refills | Status: AC

## 2020-03-28 MED ORDER — VITAMIN B-12 1000 MCG PO TABS: 1000.0000 ug | ORAL_TABLET | Freq: Every day | ORAL | 3 refills | Status: AC

## 2020-03-28 MED ORDER — AMLODIPINE BESYLATE 10 MG PO TABS: 10.0000 mg | ORAL_TABLET | Freq: Every day | ORAL | 0 refills | Status: DC

## 2020-03-28 NOTE — Progress Notes (Signed)
Pre visit review using our clinic review tool, if applicable. No additional management support is needed unless otherwise documented below in the visit note. 

## 2020-03-28 NOTE — Patient Instructions (Addendum)
Please schedule Medicare Wellness with Glenard Haring.  You are getting your pneumonia shot today.  Recommend to get the shingles vaccination 3 to 4 weeks from today.  Increase amlodipine from 5 mg to 10 mg, [facility]  This should help with the blood pressure which is a slightly elevated.  Check the  blood pressure weekly at home or at the pharmacy BP GOAL is between 110/65 and  135/85. If it is consistently higher or lower, let me know    GO TO THE LAB : Get the blood work     [place], [place] back for   for a checkup in 3 months

## 2020-03-28 NOTE — Progress Notes (Signed)
Subjective:    Patient ID: Dennis Huff, male    DOB: 02-Feb-1946, 74 y.o.   MRN: [id number]  DOS:  03/28/2020 Type of visit - description: cpx This year was very difficult for him, he lost her grandson to suicide.  He is slowly recovering and feeling "okay" at this point.   Review of Systems  Other than above, a 14 point review of systems is negative    Past Medical History:  Diagnosis Date  . Allergy   . B12 deficiency    mild   . 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 Hyperlipidemia   . Hypertension   . Normal cardiac stress test 11-2010    done for an abnormal EKG  . Prostate cancer ([place]) 11/2017    Past Surgical History:  Procedure Laterality Date  . PROSTATE BIOPSY    . WISDOM TOOTH EXTRACTION      Allergies as of 03/28/2020   No Known Allergies     Medication List       Accurate as of March 28, 2020 11:59 PM. If you have any questions, ask your nurse or doctor.        STOP taking these medications   aspirin 81 MG tablet Stopped by: Kathlene November, MD     TAKE these medications   amLODipine 10 MG tablet Commonly known as: NORVASC Take 1 tablet (10 mg total) by mouth daily. What changed:   medication strength  how much to take Changed by: Kathlene November, MD   multivitamin tablet Take 1 tablet by mouth daily.   rosuvastatin 5 MG tablet Commonly known as: CRESTOR Take 1 tablet (5 mg total) by mouth daily.   Shingrix injection Generic drug: Zoster Vaccine Adjuvanted Inject 0.5 mLs into the muscle once for 1 dose. Started by: Kathlene November, MD   vitamin B-12 1000 MCG tablet Commonly known as: CYANOCOBALAMIN Take 1 tablet (1,000 mcg total) by mouth daily.          Objective:   Physical Exam BP (!) 149/74 (BP Location: Left Arm, Patient Position: Sitting, Cuff Size: Small)   Pulse 86   Temp 98.1 F (36.7 C) (Oral)   Resp 18   Ht 6' (1.829 m)   Wt 207 lb 4 oz (94 kg)   SpO2 97%   BMI 28.11  kg/m  General: Well developed, NAD, BMI noted Neck: No  thyromegaly  HEENT:  Normocephalic . Face symmetric, atraumatic Lungs:  CTA B Normal respiratory effort, no intercostal retractions, no accessory muscle use. Heart: RRR,  no murmur.  Abdomen:  Not distended, soft, non-tender. No rebound or rigidity.   Lower extremities: no pretibial edema bilaterally  Skin: Exposed areas without rash. Not pale. Not jaundice Neurologic:  alert & oriented X3.  Speech normal, gait appropriate for age and unassisted Strength symmetric and appropriate for age.  Psych: Cognition and judgment appear intact.  Cooperative with normal attention span and concentration.  Behavior appropriate. No anxious or depressed appearing.     Assessment     Assessment HTN started meds 02/2018 Hyperglycemia, A1c 5.9  2010 Hyperlipidemia: Intolerant to Lipitor, Pravachol Prostate cancer: DX 11-2017, patient elected to external XRT MSK: used to be on prn meloxicam Mild B12 deficiency-- oral supplements  Abnormal EKG, abnormal stress test 11-2010 Cytopenia, hematology evaluation 2011, Rx observation  PLAN Here for CPX HTN: BP today slightly elevated, was also slightly elevated at home  when a nurse check and at the urology office 2 days ago.  We will check labs increase amlodipine to 10 mg, watch for edema. Hyperglycemia: Has a healthy lifestyle, check A1c Hyperlipidemia: On Crestor, checking labs B12 deficiency: Not taking supplements regularly, Rx sent.  Encouraged to do daily. RTC 3 months to check blood pressure     This visit occurred during the SARS-CoV-2 public health emergency.  Safety protocols were in place, including screening questions prior to the visit, additional usage of staff PPE, and extensive cleaning of exam room while observing appropriate contact time as indicated for disinfecting solutions.

## 2020-03-29 ENCOUNTER — Encounter: Payer: Self-pay | Admitting: Internal Medicine

## 2020-03-29 NOTE — Assessment & Plan Note (Signed)
Here for CPX HTN: BP today slightly elevated, was also slightly elevated at home when a nurse check and at the urology office 2 days ago.  We will check labs increase amlodipine to 10 mg, watch for edema. Hyperglycemia: Has a healthy lifestyle, check A1c Hyperlipidemia: On Crestor, checking labs B12 deficiency: Not taking supplements regularly, Rx sent.  Encouraged to do daily. RTC 3 months to check blood pressure

## 2020-03-29 NOTE — Assessment & Plan Note (Signed)
-  Td 03/22/2017 - pnm 23: 2011, booster today - prevnar 01-2014 - zostavax 2012 -  shingrex rx printed - s/p covid vaccines  - rec flu shot q year  -CCS: s/p several Cscopes per pt , Cscope 04-2007 normal (Dr Johna Roles), Cscope again 11/2017, 10 years per GI letter   - Prostate ca: saw  Urology this week, next OV 1 year   -Diet and exercise: Doing well, active at home, works in his yard, trying to eat healthy. -Labs: CMP, FLP, CBC, A1c

## 2020-04-02 ENCOUNTER — Telehealth: Payer: Self-pay | Admitting: Internal Medicine

## 2020-04-02 NOTE — Telephone Encounter (Signed)
Left message for patient to call back and schedule Medicare Annual Wellness Visit (AWV) with Nurse Health Advisor   This should be a 26 MINUTE VISIT.  Last AWV 03/24/18

## 2020-04-11 ENCOUNTER — Other Ambulatory Visit: Payer: Self-pay | Admitting: Internal Medicine

## 2020-04-12 ENCOUNTER — Encounter: Payer: Self-pay | Admitting: Internal Medicine

## 2020-04-27 ENCOUNTER — Other Ambulatory Visit: Payer: Self-pay | Admitting: Internal Medicine

## 2020-05-04 ENCOUNTER — Other Ambulatory Visit: Payer: Self-pay | Admitting: Internal Medicine

## 2020-05-22 ENCOUNTER — Other Ambulatory Visit: Payer: Self-pay | Admitting: Internal Medicine

## 2020-06-17 DIAGNOSIS — Z20822 Contact with and (suspected) exposure to covid-19: Secondary | ICD-10-CM | POA: Diagnosis not present

## 2020-07-02 ENCOUNTER — Encounter: Payer: Self-pay | Admitting: Internal Medicine

## 2020-07-02 ENCOUNTER — Ambulatory Visit (INDEPENDENT_AMBULATORY_CARE_PROVIDER_SITE_OTHER): Payer: Medicare Other | Admitting: Internal Medicine

## 2020-07-02 ENCOUNTER — Other Ambulatory Visit: Payer: Self-pay

## 2020-07-02 VITALS — BP 147/77 | HR 73 | Temp 98.1°F | Resp 16 | Ht 72.0 in | Wt 207.0 lb

## 2020-07-02 DIAGNOSIS — E538 Deficiency of other specified B group vitamins: Secondary | ICD-10-CM

## 2020-07-02 DIAGNOSIS — Z23 Encounter for immunization: Secondary | ICD-10-CM | POA: Diagnosis not present

## 2020-07-02 DIAGNOSIS — I10 Essential (primary) hypertension: Secondary | ICD-10-CM | POA: Diagnosis not present

## 2020-07-02 NOTE — Progress Notes (Signed)
Pre visit review using our clinic review tool, if applicable. No additional management support is needed unless otherwise documented below in the visit note. 

## 2020-07-02 NOTE — Patient Instructions (Addendum)
Check the  blood pressure  weekly   BP GOAL is between 110/65 and  135/85. If it is consistently higher or lower, let me know    HOW TO TAKE YOUR BLOOD PRESSURE:   Rest 5 minutes before taking your blood pressure.   Don't smoke or drink caffeinated beverages for at least 30 minutes before.   Take your blood pressure before (not after) you eat.   Sit comfortably with your back supported and both feet on the floor (don't cross your legs).   Elevate your arm to heart level on a table or a desk.   Use the proper sized cuff. It should fit smoothly and snugly around your bare upper arm. There should be enough room to slip a fingertip under the cuff. The bottom edge of the cuff should be 1 inch above the crease of the elbow.   Ideally, take 3 measurements at one sitting and record the average.    GO TO THE LAB : Get the blood work     [place], [place] back for a checkup in 6 months.

## 2020-07-02 NOTE — Progress Notes (Signed)
Subjective:    Patient ID: Dennis Huff, male    DOB: 11-20-1945, 74 y.o.   MRN: [id number]  DOS:  07/02/2020 Type of visit - description: Follow-up Since the last office visit he is doing well. BP noted to be slightly elevated. He is doing well with physical activity, follows a low-salt diet. Immunizations reviewed   Review of Systems Denies chest pain no difficulty breathing No LUTS   Past Medical History:  Diagnosis Date  . Allergy   . B12 deficiency    mild   . 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 Hyperlipidemia   . Hypertension   . Normal cardiac stress test 11-2010    done for an abnormal EKG  . Prostate cancer ([place]) 11/2017    Past Surgical History:  Procedure Laterality Date  . PROSTATE BIOPSY    . WISDOM TOOTH EXTRACTION      Allergies as of 07/02/2020   No Known Allergies     Medication List       Accurate as of July 02, 2020 11:59 PM. If you have any questions, ask your nurse or doctor.        amLODipine 10 MG tablet Commonly known as: NORVASC Take 1 tablet (10 mg total) by mouth daily.   multivitamin tablet Take 1 tablet by mouth daily.   rosuvastatin 5 MG tablet Commonly known as: CRESTOR Take 1 tablet (5 mg total) by mouth daily.   vitamin B-12 1000 MCG tablet Commonly known as: CYANOCOBALAMIN Take 1 tablet (1,000 mcg total) by mouth daily.          Objective:   Physical Exam BP (!) 147/77 (BP Location: Left Arm, Patient Position: Sitting, Cuff Size: Normal)   Pulse 73   Temp 98.1 F (36.7 C) (Oral)   Resp 16   Ht 6' (1.829 m)   Wt 207 lb (93.9 kg)   SpO2 96%   BMI 28.07 kg/m  General:   Well developed, NAD, BMI noted. HEENT:  Normocephalic . Face symmetric, atraumatic Lungs:  CTA B Normal respiratory effort, no intercostal retractions, no accessory muscle use. Heart: RRR,  no murmur. Abdomen: Not distended, soft, nontender Lower extremities: no  pretibial edema bilaterally  Skin: Not pale. Not jaundice Neurologic:  alert & oriented X3.  Speech normal, gait appropriate for age and unassisted Psych--  Cognition and judgment appear intact.  Cooperative with normal attention span and concentration.  Behavior appropriate. No anxious or depressed appearing.      Assessment     Assessment HTN started meds 02/2018 Hyperglycemia, A1c 5.9  2010 Hyperlipidemia: Intolerant to Lipitor, Pravachol Prostate cancer: DX 11-2017, patient elected external XRT MSK: used to be on prn meloxicam Mild B12 deficiency-- oral supplements  Abnormal EKG, abnormal stress test 11-2010 Cytopenia, hematology evaluation 2011, Rx observation  PLAN HTN: On amlodipine, good compliance, follows a healthy lifestyle, no recent BPs at home, BP elevated today,  recheck: 160/85. Plan: No change for now, check ambulatory BPs, call if not at goal. High cholesterol: On Crestor, controlled B12 deficiency: On OTCs, checking labs Preventive care: Had COVID booster vaccine already; flu shot today RTC 6 months, sooner if BP is not controlled   This visit occurred during the SARS-CoV-2 public health emergency.  Safety protocols were in place, including screening questions prior to the visit, additional usage of staff PPE, and extensive cleaning of exam room while observing appropriate contact  time as indicated for disinfecting solutions.

## 2020-07-03 ENCOUNTER — Encounter: Payer: Self-pay | Admitting: Internal Medicine

## 2020-07-03 LAB — VITAMIN B12: Vitamin B-12: 496 pg/mL (ref 200–1100)

## 2020-07-03 NOTE — Assessment & Plan Note (Signed)
HTN: On amlodipine, good compliance, follows a healthy lifestyle, no recent BPs at home, BP elevated today,  recheck: 160/85. Plan: No change for now, check ambulatory BPs, call if not at goal. High cholesterol: On Crestor, controlled B12 deficiency: On OTCs, checking labs Preventive care: Had COVID booster vaccine already; flu shot today RTC 6 months, sooner if BP is not controlled

## 2020-11-06 ENCOUNTER — Other Ambulatory Visit: Payer: Self-pay | Admitting: Internal Medicine

## 2020-12-31 ENCOUNTER — Encounter: Payer: Self-pay | Admitting: Internal Medicine

## 2020-12-31 ENCOUNTER — Ambulatory Visit (INDEPENDENT_AMBULATORY_CARE_PROVIDER_SITE_OTHER): Payer: Medicare Other | Admitting: Internal Medicine

## 2020-12-31 ENCOUNTER — Other Ambulatory Visit: Payer: Self-pay

## 2020-12-31 VITALS — BP 144/82 | HR 72 | Temp 98.1°F | Resp 16 | Ht 72.0 in | Wt 207.2 lb

## 2020-12-31 DIAGNOSIS — R739 Hyperglycemia, unspecified: Secondary | ICD-10-CM | POA: Diagnosis not present

## 2020-12-31 DIAGNOSIS — E538 Deficiency of other specified B group vitamins: Secondary | ICD-10-CM | POA: Diagnosis not present

## 2020-12-31 DIAGNOSIS — I10 Essential (primary) hypertension: Secondary | ICD-10-CM | POA: Diagnosis not present

## 2020-12-31 LAB — HEMOGLOBIN A1C: Hgb A1c MFr Bld: 6.1 % (ref 4.6–6.5)

## 2020-12-31 LAB — BASIC METABOLIC PANEL
BUN: 13 mg/dL (ref 6–23)
CO2: 29 mEq/L (ref 19–32)
Calcium: 9.4 mg/dL (ref 8.4–10.5)
Chloride: 107 mEq/L (ref 96–112)
Creatinine, Ser: 1.13 mg/dL (ref 0.40–1.50)
GFR: 64.23 mL/min (ref >60.00)
Glucose, Bld: 102 mg/dL — ABNORMAL HIGH (ref 70–99)
Potassium: 4.2 mEq/L (ref 3.5–5.1)
Sodium: 142 mEq/L (ref 135–145)

## 2020-12-31 NOTE — Progress Notes (Signed)
Subjective:    Patient ID: Dennis Huff, male    DOB: Apr 24, 1946, 75 y.o.   MRN: [id number]  DOS:  12/31/2020 Type of visit - description: Routine visit  Office visit he is doing well and has no major concerns. No recent ambulatory BPs. Good med compliance.   Review of Systems Denies chest pain no difficulty breathing No nausea, vomiting, diarrhea. No gross hematuria  Past Medical History:  Diagnosis Date  . Allergy   . B12 deficiency    mild   . 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 Hyperlipidemia   . Hypertension   . Normal cardiac stress test 11-2010    done for an abnormal EKG  . Prostate cancer ([place]) 11/2017    Past Surgical History:  Procedure Laterality Date  . PROSTATE BIOPSY    . WISDOM TOOTH EXTRACTION      Allergies as of 12/31/2020   No Known Allergies     Medication List       Accurate as of December 31, 2020  8:28 PM. If you have any questions, ask your nurse or doctor.        amLODipine 10 MG tablet Commonly known as: NORVASC Take 1 tablet (10 mg total) by mouth daily.   multivitamin tablet Take 1 tablet by mouth daily.   rosuvastatin 5 MG tablet Commonly known as: CRESTOR Take 1 tablet (5 mg total) by mouth daily.   vitamin B-12 1000 MCG tablet Commonly known as: CYANOCOBALAMIN Take 1 tablet (1,000 mcg total) by mouth daily.          Objective:   Physical Exam BP (!) 144/82 (BP Location: Left Arm, Patient Position: Sitting, Cuff Size: Small)   Pulse 72   Temp 98.1 F (36.7 C) (Oral)   Resp 16   Ht 6' (1.829 m)   Wt 207 lb 4 oz (94 kg)   SpO2 98%   BMI 28.11 kg/m  General:   Well developed, NAD, BMI noted. HEENT:  Normocephalic . Face symmetric, atraumatic Lungs:  CTA B Normal respiratory effort, no intercostal retractions, no accessory muscle use. Heart: RRR,  no murmur.  Lower extremities: no pretibial edema bilaterally  Skin: Not pale. Not  jaundice Neurologic:  alert & oriented X3.  Speech normal, gait appropriate for age and unassisted Psych--  Cognition and judgment appear intact.  Cooperative with normal attention span and concentration.  Behavior appropriate. No anxious or depressed appearing.      Assessment       Assessment HTN started meds 02/2018 Hyperglycemia, A1c 5.9  2010 Hyperlipidemia: Intolerant to Lipitor, Pravachol Prostate cancer: DX 11-2017, patient elected external XRT MSK: used to be on prn meloxicam Mild B12 deficiency-- oral supplements  Abnormal EKG, abnormal stress test 11-2010 Cytopenia, hematology evaluation 2011, Rx observation  PLAN HTN: On amlodipine, good compliance, he gets nervous when BPs are check and does not like to check ambulatory BPs, BP today is okay, no change,  BMP. Hyperglycemia: Noted per chart review, check A1c Hyperlipidemia: On Crestor, check FLP on RTC B12 deficiency: On oral supplements, last levels very good. Preventive care, recommend COVID-vaccine #4, he is agreeable.  Also discussed advance directive. RTC CPX 4 months   This visit occurred during the SARS-CoV-2 public health emergency.  Safety protocols were in place, including screening questions prior to the visit, additional usage of staff PPE, and extensive cleaning of exam room while observing  appropriate contact time as indicated for disinfecting solutions.

## 2020-12-31 NOTE — Patient Instructions (Addendum)
   GO TO THE LAB : Get the blood work     GO TO THE FRONT DESK, [place] Come back for   a physical exam in 4 months.      "Living will", "[place] of attorney": Advanced care planning  (If you already have a living will or healthcare power of attorney, please bring the copy to be scanned in your chart.)  Advance care planning is a process that supports adults in  understanding and sharing their preferences regarding future medical care.   The patient's preferences are recorded in documents called Advance Directives.    Advanced directives are completed (and can be modified at any time) while the patient is in full mental capacity.   The documentation should be available at all times to the patient, the family and the healthcare providers.  Bring in a copy to be scanned in your chart is an excellent idea and is recommended   This legal documents direct treatment decision making and/or appoint a surrogate to make the decision if the patient is not capable to do so.    Advance directives can be documented in many types of formats,  documents have names such as:  Lliving will  Durable power of attorney for healthcare (healthcare proxy or healthcare power of attorney)  Combined directives  Physician orders for life-sustaining treatment    More information at:  [email or web address]

## 2020-12-31 NOTE — Assessment & Plan Note (Signed)
HTN: On amlodipine, good compliance, he gets nervous when BPs are check and does not like to check ambulatory BPs, BP today is okay, no change,  BMP. Hyperglycemia: Noted per chart review, check A1c Hyperlipidemia: On Crestor, check FLP on RTC B12 deficiency: On oral supplements, last levels very good. Preventive care, recommend COVID-vaccine #4, he is agreeable.  Also discussed advance directive. RTC CPX 4 months

## 2021-03-13 DIAGNOSIS — H2513 Age-related nuclear cataract, bilateral: Secondary | ICD-10-CM | POA: Diagnosis not present

## 2021-03-13 DIAGNOSIS — H35033 Hypertensive retinopathy, bilateral: Secondary | ICD-10-CM | POA: Diagnosis not present

## 2021-03-13 DIAGNOSIS — H524 Presbyopia: Secondary | ICD-10-CM | POA: Diagnosis not present

## 2021-04-10 ENCOUNTER — Other Ambulatory Visit: Payer: Self-pay | Admitting: Internal Medicine

## 2021-04-17 LAB — PSA: PSA: 0.17

## 2021-04-24 DIAGNOSIS — R3915 Urgency of urination: Secondary | ICD-10-CM | POA: Diagnosis not present

## 2021-04-25 ENCOUNTER — Encounter: Payer: Self-pay | Admitting: Internal Medicine

## 2021-04-25 ENCOUNTER — Ambulatory Visit (INDEPENDENT_AMBULATORY_CARE_PROVIDER_SITE_OTHER): Payer: Medicare Other | Admitting: Internal Medicine

## 2021-04-25 ENCOUNTER — Other Ambulatory Visit: Payer: Self-pay

## 2021-04-25 VITALS — BP 124/72 | HR 71 | Temp 98.2°F | Resp 16 | Ht 72.0 in | Wt 200.5 lb

## 2021-04-25 DIAGNOSIS — R739 Hyperglycemia, unspecified: Secondary | ICD-10-CM | POA: Diagnosis not present

## 2021-04-25 DIAGNOSIS — E785 Hyperlipidemia, unspecified: Secondary | ICD-10-CM

## 2021-04-25 DIAGNOSIS — I10 Essential (primary) hypertension: Secondary | ICD-10-CM | POA: Diagnosis not present

## 2021-04-25 DIAGNOSIS — Z0000 Encounter for general adult medical examination without abnormal findings: Secondary | ICD-10-CM | POA: Diagnosis not present

## 2021-04-25 LAB — COMPREHENSIVE METABOLIC PANEL
ALT: 31 U/L (ref 0–53)
AST: 25 U/L (ref 0–37)
Albumin: 4.4 g/dL (ref 3.5–5.2)
Alkaline Phosphatase: 94 U/L (ref 39–117)
BUN: 18 mg/dL (ref 6–23)
CO2: 29 mEq/L (ref 19–32)
Calcium: 9.9 mg/dL (ref 8.4–10.5)
Chloride: 104 mEq/L (ref 96–112)
Creatinine, Ser: 1.18 mg/dL (ref 0.40–1.50)
GFR: 60.85 mL/min (ref >60.00)
Glucose, Bld: 86 mg/dL (ref 70–99)
Potassium: 4.6 mEq/L (ref 3.5–5.1)
Sodium: 141 mEq/L (ref 135–145)
Total Bilirubin: 0.9 mg/dL (ref 0.2–1.2)
Total Protein: 7 g/dL (ref 6.0–8.3)

## 2021-04-25 LAB — LIPID PANEL
Cholesterol: 154 mg/dL (ref 0–200)
HDL: 49.6 mg/dL (ref >39.00)
LDL Cholesterol: 91 mg/dL (ref 0–99)
NonHDL: 104.51
Total CHOL/HDL Ratio: 3
Triglycerides: 66 mg/dL (ref 0.0–149.0)
VLDL: 13.2 mg/dL (ref 0.0–40.0)

## 2021-04-25 LAB — CBC WITH DIFFERENTIAL/PLATELET
Basophils Absolute: 0 10*3/uL (ref 0.0–0.1)
Basophils Relative: 1 % (ref 0.0–3.0)
Eosinophils Absolute: 0.1 10*3/uL (ref 0.0–0.7)
Eosinophils Relative: 3.1 % (ref 0.0–5.0)
HCT: 39.8 % (ref 39.0–52.0)
Hemoglobin: 13.2 g/dL (ref 13.0–17.0)
Lymphocytes Relative: 29 % (ref 12.0–46.0)
Lymphs Abs: 0.9 10*3/uL (ref 0.7–4.0)
MCHC: 33.2 g/dL (ref 30.0–36.0)
MCV: 84.3 fl (ref 78.0–100.0)
Monocytes Absolute: 0.4 10*3/uL (ref 0.1–1.0)
Monocytes Relative: 12.2 % — ABNORMAL HIGH (ref 3.0–12.0)
Neutro Abs: 1.6 10*3/uL (ref 1.4–7.7)
Neutrophils Relative %: 54.7 % (ref 43.0–77.0)
Platelets: 213 10*3/uL (ref 150.0–400.0)
RBC: 4.73 Mil/uL (ref 4.22–5.81)
RDW: 14.2 % (ref 11.5–15.5)
WBC: 3 10*3/uL — ABNORMAL LOW (ref 4.0–10.5)

## 2021-04-25 LAB — HEMOGLOBIN A1C: Hgb A1c MFr Bld: 6 % (ref 4.6–6.5)

## 2021-04-25 NOTE — Patient Instructions (Signed)
Check the  blood pressure  BP GOAL is between 110/65 and  135/85. If it is consistently higher or lower, let me know      GO TO THE LAB : Get the blood work     [place], [place] back for  a physical exam in 1 year      "Living will", "[place] of attorney": Advanced care planning  (If you already have a living will or healthcare power of attorney, please bring the copy to be scanned in your chart.)  Advance care planning is a process that supports adults in  understanding and sharing their preferences regarding future medical care.   The patient's preferences are recorded in documents called Advance Directives.    Advanced directives are completed (and can be modified at any time) while the patient is in full mental capacity.   The documentation should be available at all times to the patient, the family and the healthcare providers.  Bring in a copy to be scanned in your chart is an excellent idea and is recommended   This legal documents direct treatment decision making and/or appoint a surrogate to make the decision if the patient is not capable to do so.    Advance directives can be documented in many types of formats,  documents have names such as:  Lliving will  Durable power of attorney for healthcare (healthcare proxy or healthcare power of attorney)  Combined directives  Physician orders for life-sustaining treatment    More information at:  [email or web address]

## 2021-04-25 NOTE — Progress Notes (Signed)
Subjective:    Patient ID: Dennis Huff, male    DOB: 1946-05-31, 75 y.o.   MRN: [id number]  DOS:  04/25/2021 Type of visit - description: CPX  Here for CPX. Doing well.  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, Rx observation   Hyperlipidemia    Hypertension    Normal cardiac stress test 11-2010    done for an abnormal EKG   Prostate cancer ([place]) 11/2017    Past Surgical History:  Procedure Laterality Date   PROSTATE BIOPSY     WISDOM TOOTH EXTRACTION      Social History   Socioeconomic History   Marital status: Married    Spouse name: Not on file   Number of children: 2   Years of education: Not on file   Highest education level: Not on file  Occupational History   Occupation: retired -- Engineer, building services: [employer]  Tobacco Use   Smoking status: Never   Smokeless tobacco: Never  Vaping Use   Vaping Use: Never used  Substance and Sexual Activity   Alcohol use: No   Drug use: No   Sexual activity: Not Currently  Other Topics Concern   Not on file  Social History Narrative   Lives w/ wife, lost mother in law ~ 2017   2 adult and independent children   lost a 36 y/o g-son 09/2019    Social Determinants of Health   Financial Resource Strain: Not on file  Food Insecurity: Not on file  Transportation Needs: Not on file  Physical Activity: Not on file  Stress: Not on file  Social Connections: Not on file  Intimate Partner Violence: Not on file     Allergies as of 04/25/2021   No Known Allergies      Medication List        Accurate as of April 25, 2021 11:59 PM. If you have any questions, ask your nurse or doctor.          amLODipine 10 MG tablet Commonly known as: NORVASC Take 1 tablet (10 mg total) by mouth daily.   multivitamin tablet Take 1 tablet by mouth daily.    rosuvastatin 5 MG tablet Commonly known as: CRESTOR Take 1 tablet (5 mg total) by mouth daily.   vitamin B-12 1000 MCG tablet Commonly known as: CYANOCOBALAMIN Take 1 tablet (1,000 mcg total) by mouth daily.           Objective:   Physical Exam BP 124/72 (BP Location: Left Arm, Patient Position: Sitting, Cuff Size: Normal)   Pulse 71   Temp 98.2 F (36.8 C) (Oral)   Resp 16   Ht 6' (1.829 m)   Wt 200 lb 8 oz (90.9 kg)   SpO2 98%   BMI 27.19 kg/m  General: Well developed, NAD, BMI noted Neck: No  thyromegaly  HEENT:  Normocephalic . Face symmetric, atraumatic Lungs:  CTA B Normal respiratory effort, no intercostal retractions, no accessory muscle use. Heart: RRR,  no murmur.  Abdomen:  Not distended, soft, non-tender. No rebound or rigidity.   Lower extremities: no pretibial edema bilaterally  Skin: Exposed areas without rash. Not pale. Not jaundice Neurologic:  alert & oriented X3.  Speech normal, gait appropriate for  age and unassisted Strength symmetric and appropriate for age.  Psych: Cognition and judgment appear intact.  Cooperative with normal attention span and concentration.  Behavior appropriate. No anxious or depressed appearing.     Assessment     Assessment HTN started meds 02/2018 Hyperglycemia, A1c 5.9  2010 Hyperlipidemia: Intolerant to Lipitor, Pravachol Prostate cancer: DX 11-2017, patient elected external XRT MSK: used to be on prn meloxicam Mild B12 deficiency-- oral supplements  Abnormal EKG, abnormal stress test 11-2010 Cytopenia, hematology evaluation 2011, Rx observation  PLAN Here for CPX Chronic medical problem: Continue current meds, check labs. Recently saw urology, was given good reports. RTC 1 year      This visit occurred during the SARS-CoV-2 public health emergency.  Safety protocols were in place, including screening questions prior to the visit, additional usage of staff PPE, and extensive cleaning of exam room  while observing appropriate contact time as indicated for disinfecting solutions.

## 2021-04-26 NOTE — Assessment & Plan Note (Signed)
-  Td 03/22/2017  - pnm 23: 2011, 2021; prevnar 01-2014 - zostavax 2012 -  shingrex x 1 7/-2022, rec booster  - s/p covid vax x 4  - rec flu shot q year  -CCS: s/p several Cscopes per pt , Cscope 04-2007 normal (Dr Johna Roles), Cscope again 11/2017, 10 years per GI letter   - Prostate cancer:  sees  Urology  -Diet and exercise:  - labs: CMP, FLP, CBC, A1c - POA: Discussed, see AVS

## 2021-04-26 NOTE — Assessment & Plan Note (Signed)
Here for CPX Chronic medical problem: Continue current meds, check labs. Recently saw urology, was given good reports. RTC 1 year

## 2021-04-27 ENCOUNTER — Other Ambulatory Visit: Payer: Self-pay | Admitting: Internal Medicine

## 2021-04-30 ENCOUNTER — Encounter: Payer: Self-pay | Admitting: Internal Medicine

## 2021-05-15 NOTE — ED Notes (Signed)
ED Triage Note       ED Secondary Triage Entered On:  05/15/2021 16:32 EDT    Performed On:  05/15/2021 16:31 EDT by Orlin Hilding A-RN               General Information   Barriers to Learning :   None evident   ED Home Meds Section :   Document assessment   [facility] ED Fall Risk Section :   Document assessment   ED Advance Directives Section :   Document assessment   ED Palliative Screen :   Document assessment   Orlin Hilding A-RN - 05/15/2021 16:31 EDT   (As Of: 05/15/2021 16:32:08 EDT)   Problems(Active)    Atrial fibrillation (SNOMED CT  :70488891 )  Name of Problem:   Atrial fibrillation ; Recorder:   Bari Mantis, RN, TAMARA L; Confirmation:   Confirmed ; Classification:   Patient Stated ; Code:   [id number] ; Contributor System:   Dietitian ; Last Updated:   05/15/2021 14:18 EDT ; Life Cycle Date:   05/15/2021 ; Life Cycle Status:   Active ; Vocabulary:   SNOMED CT        CVA (cerebrovascular accident) (SNOMED CT  :828003491 )  Name of Problem:   CVA (cerebrovascular accident) ; Recorder:   Bari Mantis, RN, TAMARA L; Confirmation:   Confirmed ; Classification:   Patient Stated ; Code:   [id number] ; Contributor System:   Dietitian ; Last Updated:   05/15/2021 14:19 EDT ; Life Cycle Date:   05/15/2021 ; Life Cycle Status:   Active ; Vocabulary:   SNOMED CT        Diabetes (SNOMED CT  :948016553 )  Name of Problem:   Diabetes ; Recorder:   Bari Mantis, RN, TAMARA L; Confirmation:   Confirmed ; Classification:   Patient Stated ; Code:   [id number] ; Contributor System:   PowerChart ; Last Updated:   05/15/2021 14:18 EDT ; Life Cycle Date:   05/15/2021 ; Life Cycle Status:   Active ; Vocabulary:   SNOMED CT        Hypercholesteremia (SNOMED CT  :75449201 )  Name of Problem:   Hypercholesteremia ; Recorder:   Bari Mantis, RN, TAMARA L; Confirmation:   Confirmed ; Classification:   Patient Stated ; Code:   [id number] ; Contributor System:   Dietitian ; Last Updated:   05/15/2021 14:18 EDT ; Life Cycle Date:   05/15/2021 ; Life Cycle Status:   Active  ; Vocabulary:   SNOMED CT        Hypertension (SNOMED CT  :5883254982 )  Name of Problem:   Hypertension ; Recorder:   Bari Mantis, RN, TAMARA L; Confirmation:   Confirmed ; Classification:   Patient Stated ; Code:   [id number] ; Contributor System:   Dietitian ; Last Updated:   05/15/2021 14:18 EDT ; Life Cycle Date:   05/15/2021 ; Life Cycle Status:   Active ; Vocabulary:   SNOMED CT          Diagnoses(Active)    Shoulder pain  Date:   05/15/2021 ; Diagnosis Type:   Reason For Visit ; Confirmation:   Complaint of ; Clinical Dx:   Shoulder pain ; Classification:   Medical ; Clinical Service:   Emergency medicine ; Code:   PNED ; Probability:   0 ; Diagnosis Code:   [id number]             -    Procedure History   (As  Of: 05/15/2021 16:32:08 EDT)     [facility] Fall Risk Assessment Tool   Hx of falling last 3 months ED Fall :   No   Patient confused or disoriented ED Fall :   No   Patient intoxicated or sedated ED Fall :   No   Patient impaired gait ED Fall :   No   Use a mobility assistance device ED Fall :   No   Patient altered elimination ED Fall :   No   [facility] ED Fall Score :   0    Orlin Hilding A-RN - 05/15/2021 16:31 EDT   ED Advance Directive   Advance Directive :   No   Orlin Hilding A-RN - 05/15/2021 16:31 EDT   Palliative Care   Does the Patient have a Life Limiting Illness :   None of the above   Orlin Hilding A-RN - 05/15/2021 16:31 EDT

## 2021-05-15 NOTE — ED Notes (Signed)
ED Patient Education Note     Patient Education Materials Follows:  Orthopedics     Shoulder Sprain      A shoulder sprain is a partial or complete tear in one of the tough, fiber-like tissues (ligaments) in the shoulder. The ligaments in the shoulder help to hold the shoulder in place.      What are the causes?    This condition may be caused by:   A fall.     A hit to the shoulder.     A twist of the arm.        What increases the risk?    You are more likely to develop this condition if you:   Play sports.     Have problems with balance or coordination.        What are the signs or symptoms?    Symptoms of this condition include:   Pain when moving the shoulder.     Limited ability to move the shoulder.     Swelling and tenderness on top of the shoulder.     Warmth in the shoulder.     A change in the shape of the shoulder.     Redness or bruising on the shoulder.        How is this diagnosed?    This condition is diagnosed with:   A physical exam. During the exam, you may be asked to do simple exercises with your shoulder.     Imaging tests such as X-rays, MRI, or a CT scan. These tests can show how severe the sprain is.        How is this treated?    This condition may be treated with:   Rest.     Pain medicine.     Ice.     A sling or brace. This is used to keep the arm still while the shoulder is healing.     Physical therapy or rehabilitation exercises. These help to improve the range of motion and strength of the shoulder.     Surgery (rare). Surgery may be needed if the sprain caused a joint to become unstable. Surgery may also be needed to reduce pain.      Some people may develop ongoing shoulder pain or lose some range of motion in the shoulder. However, most people do not develop long-term problems.      Follow these instructions at home:      If you have a sling or brace:     Wear the sling or brace as told by your health care provider. Remove it only as told by your health care provider.     Loosen  the sling or brace if your fingers tingle, become numb, or turn cold and blue.     Keep the sling or brace clean.     If the sling or brace is not waterproof:  ? Do not let it get wet.    ? Cover it with a watertight covering when you take a bath or shower.        Activity     Rest your shoulder.     Move your arm only as much as told by your health care provider, but move your hand and fingers often to prevent stiffness and swelling.     Return to your normal activities as told by your health care provider. Ask your health care provider what activities are safe for you.       Ask your health care provider when it is safe for you to drive if you have a sling or brace on your shoulder.     If you were shown how to do any exercises, do them as told by your health care provider.      General instructions     If directed, put ice on the affected area.  ? Put ice in a plastic bag.    ? Place a towel between your skin and the bag.    ? Leave the ice on for 20 minutes, 2?3 times a day.       Take over-the-counter and prescription medicines only as told by your health care provider.     Do not use any products that contain nicotine or tobacco, such as cigarettes, e-cigarettes, and chewing tobacco. These can delay healing. If you need help quitting, ask your health care provider.     Keep all follow-up visits as told by your health care provider. This is important.        Contact a health care provider if:     Your pain gets worse.     Your pain is not relieved with medicines.     You have increased redness or swelling.      Get help right away if:     You have a fever.     You cannot move your arm or shoulder.     You develop severe numbness or tingling in your arm, hand, or fingers.     Your arm, hand, or fingers feel cold and turn blue, white, or gray.      Summary     A shoulder sprain is a partial or complete tear in one of the tough, fiber-like tissues (ligaments) in the shoulder.     This condition may be caused by a  fall, a hit to the shoulder, or a twist of the arm.     Treatment usually includes rest, ice, and pain medicine as needed.     If you have a sling or brace, wear it as told by your health care provider. Remove it only as told by your health care provider.      This information is not intended to replace advice given to you by your health care provider. Make sure you discuss any questions you have with your health care provider.      Document Revised: 01/28/2018 Document Reviewed: 01/28/2018  Elsevier Patient Education ? 2021 [employer]         Arthritis    Arthritis is a term that is commonly used to refer to joint pain or joint disease. There are more than 100 types of arthritis.      What are the causes?    The most common cause of this condition is wear and tear of a joint. Other causes include:   Gout.     Inflammation of a joint.     An infection of a joint.     Sprains and other injuries near the joint.     A reaction to medicines or drugs, or an allergic reaction.      In some cases, the cause may not be known.      What are the signs or symptoms?    The main symptom of this condition is pain in the joint during movement. Other symptoms include:   Redness, swelling, or stiffness at a joint.     Warmth coming from the joint.  Fever.     Overall feeling of illness.        How is this diagnosed?    This condition may be diagnosed with a physical exam and tests, including:   Blood tests.     Urine tests.     Imaging tests, such as X-rays, an MRI, or a CT scan.      Sometimes, fluid is removed from a joint for testing.      How is this treated?    This condition may be treated with:   Treatment of the cause, if it is known.     Rest.     Raising (elevating) the joint.     Applying cold or hot packs to the joint.     Medicines to improve symptoms and reduce inflammation.     Injections of a steroid such as cortisone into the joint to help reduce pain and inflammation.      Depending on the cause of your  arthritis, you may need to make lifestyle changes to reduce stress on your joint. Changes may include:   Exercising more.     Losing weight.        Follow these instructions at home:    Medicines     Take over-the-counter and prescription medicines only as told by your health care provider.     Do not take aspirin to relieve pain if your health care provider thinks that gout may be causing your pain.      Activity     Rest your joint if told by your health care provider. Rest is important when your disease is active and your joint feels painful, swollen, or stiff.     Avoid activities that make the pain worse. It is important to balance activity with rest.     Exercise your joint regularly with range-of-motion exercises as told by your health care provider. Try doing low-impact exercise, such as:  ? Swimming.    ? Water aerobics.    ? Biking.    ? Walking.        Managing pain, stiffness, and swelling         If directed, put ice on the joint.  ? Put ice in a plastic bag.    ? Place a towel between your skin and the bag.    ? Leave the ice on for 20 minutes, 2?3 times per day.       If your joint is swollen, raise (elevate) it above the level of your heart if directed by your health care provider.     If your joint feels stiff in the morning, try taking a warm shower.     If directed, apply heat to the affected area as often as told by your health care provider. Use the heat source that your health care provider recommends, such as a moist heat pack or a heating pad. If you have diabetes, do not apply heat without permission from your health care provider. To apply heat:  ? Place a towel between your skin and the heat source.    ? Leave the heat on for 20?30 minutes.    ? Remove the heat if your skin turns bright red. This is especially important if you are unable to feel pain, heat, or cold. You may have a greater risk of getting burned.        General instructions     Do not use any products that contain nicotine  or  tobacco, such as cigarettes, e-cigarettes, and chewing tobacco. If you need help quitting, ask your health care provider.     Keep all follow-up visits as told by your health care provider. This is important.        Contact a health care provider if:     The pain gets worse.     You have a fever.      Get help right away if:     You develop severe joint pain, swelling, or redness.     Many joints become painful and swollen.     You develop severe back pain.     You develop severe weakness in your leg.     You cannot control your bladder or bowels.      Summary     Arthritis is a term that is commonly used to refer to joint pain or joint disease. There are more than 100 types of arthritis.     The most common cause of this condition is wear and tear of a joint. Other causes include gout, inflammation or infection of the joint, sprains, or allergies.     Symptoms of this condition include redness, swelling, or stiffness of the joint. Other symptoms include warmth, fever, or feeling ill.     This condition is treated with rest, elevation, medicines, and applying cold or hot packs.     Follow your health care provider's instructions about medicines, activity, exercises, and other home care treatments.      This information is not intended to replace advice given to you by your health care provider. Make sure you discuss any questions you have with your health care provider.      Document Revised: 08/01/2018 Document Reviewed: 08/01/2018  Elsevier Patient Education ? 2021 [employer]

## 2021-05-15 NOTE — ED Notes (Signed)
 ED Patient Summary       ;       [facility] Emergency Department  [address], [place] [address]  [phone]  Discharge Instructions (Patient)  Name: Ralph Brock, Ralph Brock  DOB:  08-15-1946                   MRN: [id number]                   FIN: [phone]  Reason For Visit: Shoulder pain; LFT ARM/SHOULDER/CHEST PAIN  Final Diagnosis: Osteoarthritis; Rotator cuff sprain     Visit Date: 05/15/2021 14:09:00  Address: [address] [place] [address]  Phone: [phone]     Emergency Department Providers:         Primary Physician:      LANE PRENTICE LYNWOOD Florie Lionel would like to thank you for allowing us  to assist you with your healthcare needs. The following includes patient education materials and information regarding your injury/illness.     Follow-up Instructions:  You were seen today on an emergency basis. Please contact your primary care doctor for a follow up appointment. If you received a referral to a specialist doctor, it is important you follow-up as instructed.    It is important that you call your follow-up doctor to schedule and confirm the location of your next appointment. Your doctor may practice at multiple locations. The office location of your follow-up appointment may be different to the one written on your discharge instructions.    If you do not have a primary care doctor, please call [phone] for help in finding a [facility] Provider. For help in finding a specialist doctor, please call [phone].    If your condition gets worse before your follow-up with your primary care doctor or specialist, please return to the Emergency Department.      Coronavirus 2019 (COVID-19) Reminders:     Patients age 25 - 81, with parental consent, and patients over age 78 can make an appointment for a COVID-19 vaccine. Patients can contact their [facility] doctors' offices to schedule an appointment to receive the COVID-19 vaccine.  Patients who do not have a [facility] physician can call [phone] to schedule vaccination appointments.      Follow Up Appointments:  Primary Care Provider:     Name: [name]     Phone: [phone]                 With: Address: When:   [name] [address] [place], [place] [address]  [phone] Business (1) Within 1 week   Comments:   Return to ED if symptoms worsen              Post [facility] SERVICES%>          New Medications  Printed Prescriptions  diclofenac topical (diclofenac 1% topical gel) 1 Application Topical (on the skin) 4 times a day. Refills: 0.  Last Dose:____________________  naproxen (Naprosyn 500 mg oral tablet) 1 Tabs Oral (given by mouth) 2 times a day for 7 Days. Refills: 0.  Last Dose:____________________      Allergy Info: sulfa drugs     Discharge Additional Information          Discharge Patient 05/15/21 17:03:00 EDT      Patient Education Materials:  Shoulder Sprain      A shoulder sprain is a partial or complete tear in one of the tough, fiber-like tissues (ligaments) in the shoulder. The ligaments in the shoulder help to hold the shoulder in place.      What are the causes?    This condition may be caused by:   A fall.     A hit to the shoulder.     A twist of the arm.        What increases the risk?    You are more likely to develop this condition if you:   Play sports.     Have problems with balance or coordination.        What are the signs or symptoms?    Symptoms of this condition include:   Pain when moving the shoulder.     Limited ability to move the shoulder.     Swelling and tenderness on top of the shoulder.     Warmth in the shoulder.     A change in the shape of the shoulder.     Redness or bruising on the shoulder.        How is this diagnosed?    This condition is diagnosed with:   A physical exam. During the exam, you may be asked to do simple exercises with your shoulder.     Imaging tests  such as X-rays, MRI, or a CT scan. These tests can show how severe the sprain is.        How is this treated?    This condition may be treated with:   Rest.     Pain medicine.     Ice.     A sling or brace. This is used to keep the arm still while the shoulder is healing.     Physical therapy or rehabilitation exercises. These help to improve the range of motion and strength of the shoulder.     Surgery (rare). Surgery may be needed if the sprain caused a joint to become unstable. Surgery may also be needed to reduce pain.      Some people may develop ongoing shoulder pain or lose some range of motion in the shoulder. However, most people do not develop long-term problems.      Follow these instructions at home:      If you have a sling or brace:     Wear the sling or brace as told by your health care provider. Remove it only as told by your health care provider.     Loosen the sling or brace if your fingers tingle, become numb, or turn cold and blue.     Keep the sling or brace clean.     If the sling or brace is not waterproof:  ? Do not let it get wet.    ? Cover it with a watertight covering when you take a bath or shower.        Activity     Rest your shoulder.     Move your arm only as much as told by your health care provider, but move your hand and fingers often to prevent stiffness and swelling.     Return to your normal activities as told by your health care provider. Ask your health care provider what activities are safe for you.     Ask your health care provider when it is safe for you to drive if you have a sling  or brace on your shoulder.     If you were shown how to do any exercises, do them as told by your health care provider.      General instructions     If directed, put ice on the affected area.  ? Put ice in a plastic bag.    ? Place a towel between your skin and the bag.    ? Leave the ice on for 20 minutes, 2?3 times a day.       Take over-the-counter and prescription medicines only as told by  your health care provider.     Do not use any products that contain nicotine or tobacco, such as cigarettes, e-cigarettes, and chewing tobacco. These can delay healing. If you need help quitting, ask your health care provider.     Keep all follow-up visits as told by your health care provider. This is important.        Contact a health care provider if:     Your pain gets worse.     Your pain is not relieved with medicines.     You have increased redness or swelling.      Get help right away if:     You have a fever.     You cannot move your arm or shoulder.     You develop severe numbness or tingling in your arm, hand, or fingers.     Your arm, hand, or fingers feel cold and turn blue, white, or gray.      Summary     A shoulder sprain is a partial or complete tear in one of the tough, fiber-like tissues (ligaments) in the shoulder.     This condition may be caused by a fall, a hit to the shoulder, or a twist of the arm.     Treatment usually includes rest, ice, and pain medicine as needed.     If you have a sling or brace, wear it as told by your health care provider. Remove it only as told by your health care provider.      This information is not intended to replace advice given to you by your health care provider. Make sure you discuss any questions you have with your health care provider.      Document Revised: 01/28/2018 Document Reviewed: 01/28/2018  Elsevier Patient Education ? 2021 Elsevier Inc.       Arthritis    Arthritis is a term that is commonly used to refer to joint pain or joint disease. There are more than 100 types of arthritis.      What are the causes?    The most common cause of this condition is wear and tear of a joint. Other causes include:   Gout.     Inflammation of a joint.     An infection of a joint.     Sprains and other injuries near the joint.     A reaction to medicines or drugs, or an allergic reaction.      In some cases, the cause may not be known.      What are the signs or  symptoms?    The main symptom of this condition is pain in the joint during movement. Other symptoms include:   Redness, swelling, or stiffness at a joint.     Warmth coming from the joint.     Fever.     Overall feeling of illness.  How is this diagnosed?    This condition may be diagnosed with a physical exam and tests, including:   Blood tests.     Urine tests.     Imaging tests, such as X-rays, an MRI, or a CT scan.      Sometimes, fluid is removed from a joint for testing.      How is this treated?    This condition may be treated with:   Treatment of the cause, if it is known.     Rest.     Raising (elevating) the joint.     Applying cold or hot packs to the joint.     Medicines to improve symptoms and reduce inflammation.     Injections of a steroid such as cortisone into the joint to help reduce pain and inflammation.      Depending on the cause of your arthritis, you may need to make lifestyle changes to reduce stress on your joint. Changes may include:   Exercising more.     Losing weight.        Follow these instructions at home:    Medicines     Take over-the-counter and prescription medicines only as told by your health care provider.     Do not take aspirin to relieve pain if your health care provider thinks that gout may be causing your pain.      Activity     Rest your joint if told by your health care provider. Rest is important when your disease is active and your joint feels painful, swollen, or stiff.     Avoid activities that make the pain worse. It is important to balance activity with rest.     Exercise your joint regularly with range-of-motion exercises as told by your health care provider. Try doing low-impact exercise, such as:  ? Swimming.    ? Water aerobics.    ? Biking.    ? Walking.        Managing pain, stiffness, and swelling         If directed, put ice on the joint.  ? Put ice in a plastic bag.    ? Place a towel between your skin and the bag.    ? Leave the ice on for 20  minutes, 2?3 times per day.       If your joint is swollen, raise (elevate) it above the level of your heart if directed by your health care provider.     If your joint feels stiff in the morning, try taking a warm shower.     If directed, apply heat to the affected area as often as told by your health care provider. Use the heat source that your health care provider recommends, such as a moist heat pack or a heating pad. If you have diabetes, do not apply heat without permission from your health care provider. To apply heat:  ? Place a towel between your skin and the heat source.    ? Leave the heat on for 20?30 minutes.    ? Remove the heat if your skin turns bright red. This is especially important if you are unable to feel pain, heat, or cold. You may have a greater risk of getting burned.        General instructions     Do not use any products that contain nicotine or tobacco, such as cigarettes, e-cigarettes, and chewing tobacco. If you need help quitting, ask your health care  provider.     Keep all follow-up visits as told by your health care provider. This is important.        Contact a health care provider if:     The pain gets worse.     You have a fever.      Get help right away if:     You develop severe joint pain, swelling, or redness.     Many joints become painful and swollen.     You develop severe back pain.     You develop severe weakness in your leg.     You cannot control your bladder or bowels.      Summary     Arthritis is a term that is commonly used to refer to joint pain or joint disease. There are more than 100 types of arthritis.     The most common cause of this condition is wear and tear of a joint. Other causes include gout, inflammation or infection of the joint, sprains, or allergies.     Symptoms of this condition include redness, swelling, or stiffness of the joint. Other symptoms include warmth, fever, or feeling ill.     This condition is treated with rest, elevation, medicines,  and applying cold or hot packs.     Follow your health care provider's instructions about medicines, activity, exercises, and other home care treatments.      This information is not intended to replace advice given to you by your health care provider. Make sure you discuss any questions you have with your health care provider.      Document Revised: 08/01/2018 Document Reviewed: 08/01/2018  Elsevier Patient Education ? 2021 Elsevier Inc.      ---------------------------------------------------------------------------------------------------------------------  [facility] allows patients to review your COVID and other test results as well as discharge documents from any [facility], Emergency Department, surgical center or outpatient lab. Test results are typically available 36 hours after the test is completed.     [facility] encourages you to self-enroll in the [facility] Patient Portal.     To begin your self-enrollment process, please visit [email or web address] Under [facility], click on "Sign up now".     NOTE: You must be 16 years and older to use [facility] Self-Enroll online. If you are a parent, caregiver, or guardian; you need an invite to access your child's or dependent's health records. To obtain an invite, contact the Medical Records department at [phone] Monday through Friday, 8-4:30, select option 3 . If we receive your call afterhours, we will return your call the next business day.     If you have issues trying to create or access your account, contact [employer] support at [phone] available 7 days a week 24 hours a day.     Comment:

## 2021-05-15 NOTE — ED Notes (Signed)
 ED Triage Note       ED Triage Adult Entered On:  05/15/2021 14:20 EDT    Performed On:  05/15/2021 14:15 EDT by MARIANNA, RN, TAMARA L               Triage   Numeric Rating Pain Scale :   9   Chief Complaint :   pt with left shoulder and arm pain. states, it's ok when I'm sleeping, but when I move it it almost kills me. states he's had pain for a couple days. he's [facility] and unable to give full medical hx   [place] Mode of Arrival :   Walking   Infectious Disease Documentation :   Document assessment   Temperature Oral :   36.5 degC(Converted to: 97.7 degF)    Heart Rate Monitored :   88 bpm   Respiratory Rate :   17 br/min   Systolic Blood Pressure :   165 mmHg (HI)    Diastolic Blood Pressure :   94 mmHg (HI)    SpO2 :   99 %   Patient presentation :   None of the above   Chief Complaint or Presentation suggest infection :   No   Weight Dosing :   128 kg(Converted to: 282 lb 3 oz)    Height :   181 cm(Converted to: 5 ft 11 in)    Body Mass Index Dosing :   39 kg/m2   MACBRIDE, RN, TAMARA L - 05/15/2021 14:15 EDT   DCP GENERIC CODE   Tracking Acuity :   3   Tracking Group :   ED [employer], RN, TAMARA L - 05/15/2021 14:15 EDT   ED General Section :   Document assessment   Pregnancy Status :   N/A   ED Allergies Section :   Document assessment   ED Reason for Visit Section :   Document assessment   ED Home Meds Section :   Document assessment   ED Quick Assessment :   Patient appears awake, alert, oriented to baseline. Skin warm and dry. Moves all extremities. Respiration even and unlabored. Appears in no apparent distress.   MACBRIDE, RN, TAMARA L - 05/15/2021 14:15 EDT   ID Risk Screen Symptoms   Recent Travel History :   No recent travel   TB Symptom Screen :   No symptoms   Last 90 days COVID-19 ID :   No   Close Contact with COVID-19 ID :   No   Last 14 days COVID-19 ID :   No   MACBRIDE, RN, TAMARA L - 05/15/2021 14:15 EDT   Allergies   (As Of: 05/15/2021 14:20:28 EDT)   Allergies (Active)    sulfa drugs  Estimated Onset Date:   Unspecified ; Reactions:   Unknown ; Created ByBETHA MARIANNA, RN, TAMARA L; Reaction Status:   Active ; Category:   Drug ; Substance:   sulfa drugs ; Type:   Allergy ; Severity:   Unknown ; Updated By:   MARIANNA, RN, EMMIE CROME; Reviewed Date:   05/15/2021 14:18 EDT        Psycho-Social   Last 3 mo, thoughts killing self/others :   Patient denies   Right click within box for Suspected Abuse policy link. :   None   Feels Safe Where Live :   Yes   ED Behavioral Activity Rating Scale :   4 - Quiet  and awake (normal level of activity)   MACBRIDE, RN, EMMIE CROME - 05/15/2021 14:15 EDT   ED Home Med List   Medication List   (As Of: 05/15/2021 14:20:28 EDT)   Unable To Meryl BENNETTS, RN, TAMARA L - 05/15/2021 14:20:14           ED Reason for Visit   (As Of: 05/15/2021 14:20:28 EDT)   Problems(Active)    Atrial fibrillation (SNOMED CT  :17656987 )  Name of Problem:   Atrial fibrillation ; Recorder:   BENNETTS, RN, TAMARA L; Confirmation:   Confirmed ; Classification:   Patient Stated ; Code:   [id number] ; Contributor System:   PowerChart ; Last Updated:   05/15/2021 14:18 EDT ; Life Cycle Date:   05/15/2021 ; Life Cycle Status:   Active ; Vocabulary:   SNOMED CT        CVA (cerebrovascular accident) (SNOMED CT  :654362987 )  Name of Problem:   CVA (cerebrovascular accident) ; Recorder:   BENNETTS, RN, TAMARA L; Confirmation:   Confirmed ; Classification:   Patient Stated ; Code:   [id number] ; Contributor System:   PowerChart ; Last Updated:   05/15/2021 14:19 EDT ; Life Cycle Date:   05/15/2021 ; Life Cycle Status:   Active ; Vocabulary:   SNOMED CT        Diabetes (SNOMED CT  :878410989 )  Name of Problem:   Diabetes ; Recorder:   BENNETTS, RN, TAMARA L; Confirmation:   Confirmed ; Classification:   Patient Stated ; Code:   [id number] ; Contributor System:   PowerChart ; Last Updated:   05/15/2021 14:18 EDT ; Life Cycle Date:   05/15/2021 ; Life Cycle Status:   Active ; Vocabulary:   SNOMED CT         Hypercholesteremia (SNOMED CT  :[id number] )  Name of Problem:   Hypercholesteremia ; Recorder:   BENNETTS, RN, TAMARA L; Confirmation:   Confirmed ; Classification:   Patient Stated ; Code:   [id number] ; Contributor System:   PowerChart ; Last Updated:   05/15/2021 14:18 EDT ; Life Cycle Date:   05/15/2021 ; Life Cycle Status:   Active ; Vocabulary:   SNOMED CT        Hypertension (SNOMED CT  :[id number] )  Name of Problem:   Hypertension ; Recorder:   BENNETTS, RN, TAMARA L; Confirmation:   Confirmed ; Classification:   Patient Stated ; Code:   [id number] ; Contributor System:   PowerChart ; Last Updated:   05/15/2021 14:18 EDT ; Life Cycle Date:   05/15/2021 ; Life Cycle Status:   Active ; Vocabulary:   SNOMED CT          Diagnoses(Active)    Shoulder pain  Date:   05/15/2021 ; Diagnosis Type:   Reason For Visit ; Confirmation:   Complaint of ; Clinical Dx:   Shoulder pain ; Classification:   Medical ; Clinical Service:   Emergency medicine ; Code:   PNED ; Probability:   0 ; Diagnosis Code:   [id number]

## 2021-05-15 NOTE — ED Notes (Signed)
ED Patient Education Note     Patient Education Materials Follows:  Orthopedics     Arthritis    Arthritis is a term that is commonly used to refer to joint pain or joint disease. There are more than 100 types of arthritis.      What are the causes?    The most common cause of this condition is wear and tear of a joint. Other causes include:   Gout.     Inflammation of a joint.     An infection of a joint.     Sprains and other injuries near the joint.     A reaction to medicines or drugs, or an allergic reaction.      In some cases, the cause may not be known.      What are the signs or symptoms?    The main symptom of this condition is pain in the joint during movement. Other symptoms include:   Redness, swelling, or stiffness at a joint.     Warmth coming from the joint.     Fever.     Overall feeling of illness.        How is this diagnosed?    This condition may be diagnosed with a physical exam and tests, including:   Blood tests.     Urine tests.     Imaging tests, such as X-rays, an MRI, or a CT scan.      Sometimes, fluid is removed from a joint for testing.      How is this treated?    This condition may be treated with:   Treatment of the cause, if it is known.     Rest.     Raising (elevating) the joint.     Applying cold or hot packs to the joint.     Medicines to improve symptoms and reduce inflammation.     Injections of a steroid such as cortisone into the joint to help reduce pain and inflammation.      Depending on the cause of your arthritis, you may need to make lifestyle changes to reduce stress on your joint. Changes may include:   Exercising more.     Losing weight.        Follow these instructions at home:    Medicines     Take over-the-counter and prescription medicines only as told by your health care provider.     Do not take aspirin to relieve pain if your health care provider thinks that gout may be causing your pain.      Activity     Rest your joint if told by your health care  provider. Rest is important when your disease is active and your joint feels painful, swollen, or stiff.     Avoid activities that make the pain worse. It is important to balance activity with rest.     Exercise your joint regularly with range-of-motion exercises as told by your health care provider. Try doing low-impact exercise, such as:  ? Swimming.    ? Water aerobics.    ? Biking.    ? Walking.        Managing pain, stiffness, and swelling         If directed, put ice on the joint.  ? Put ice in a plastic bag.    ? Place a towel between your skin and the bag.    ? Leave the ice on for 20 minutes, 2?3 times per   day.       If your joint is swollen, raise (elevate) it above the level of your heart if directed by your health care provider.     If your joint feels stiff in the morning, try taking a warm shower.     If directed, apply heat to the affected area as often as told by your health care provider. Use the heat source that your health care provider recommends, such as a moist heat pack or a heating pad. If you have diabetes, do not apply heat without permission from your health care provider. To apply heat:  ? Place a towel between your skin and the heat source.    ? Leave the heat on for 20?30 minutes.    ? Remove the heat if your skin turns bright red. This is especially important if you are unable to feel pain, heat, or cold. You may have a greater risk of getting burned.        General instructions     Do not use any products that contain nicotine or tobacco, such as cigarettes, e-cigarettes, and chewing tobacco. If you need help quitting, ask your health care provider.     Keep all follow-up visits as told by your health care provider. This is important.        Contact a health care provider if:     The pain gets worse.     You have a fever.      Get help right away if:     You develop severe joint pain, swelling, or redness.     Many joints become painful and swollen.     You develop severe back pain.      You develop severe weakness in your leg.     You cannot control your bladder or bowels.      Summary     Arthritis is a term that is commonly used to refer to joint pain or joint disease. There are more than 100 types of arthritis.     The most common cause of this condition is wear and tear of a joint. Other causes include gout, inflammation or infection of the joint, sprains, or allergies.     Symptoms of this condition include redness, swelling, or stiffness of the joint. Other symptoms include warmth, fever, or feeling ill.     This condition is treated with rest, elevation, medicines, and applying cold or hot packs.     Follow your health care provider's instructions about medicines, activity, exercises, and other home care treatments.      This information is not intended to replace advice given to you by your health care provider. Make sure you discuss any questions you have with your health care provider.      Document Revised: 08/01/2018 Document Reviewed: 08/01/2018  Elsevier Patient Education ? 2021 [employer]         Shoulder Sprain      A shoulder sprain is a partial or complete tear in one of the tough, fiber-like tissues (ligaments) in the shoulder. The ligaments in the shoulder help to hold the shoulder in place.      What are the causes?    This condition may be caused by:   A fall.     A hit to the shoulder.     A twist of the arm.        What increases the risk?    You are more likely to develop  this condition if you:   Play sports.     Have problems with balance or coordination.        What are the signs or symptoms?    Symptoms of this condition include:   Pain when moving the shoulder.     Limited ability to move the shoulder.     Swelling and tenderness on top of the shoulder.     Warmth in the shoulder.     A change in the shape of the shoulder.     Redness or bruising on the shoulder.        How is this diagnosed?    This condition is diagnosed with:   A physical exam. During the exam,  you may be asked to do simple exercises with your shoulder.     Imaging tests such as X-rays, MRI, or a CT scan. These tests can show how severe the sprain is.        How is this treated?    This condition may be treated with:   Rest.     Pain medicine.     Ice.     A sling or brace. This is used to keep the arm still while the shoulder is healing.     Physical therapy or rehabilitation exercises. These help to improve the range of motion and strength of the shoulder.     Surgery (rare). Surgery may be needed if the sprain caused a joint to become unstable. Surgery may also be needed to reduce pain.      Some people may develop ongoing shoulder pain or lose some range of motion in the shoulder. However, most people do not develop long-term problems.      Follow these instructions at home:      If you have a sling or brace:     Wear the sling or brace as told by your health care provider. Remove it only as told by your health care provider.     Loosen the sling or brace if your fingers tingle, become numb, or turn cold and blue.     Keep the sling or brace clean.     If the sling or brace is not waterproof:  ? Do not let it get wet.    ? Cover it with a watertight covering when you take a bath or shower.        Activity     Rest your shoulder.     Move your arm only as much as told by your health care provider, but move your hand and fingers often to prevent stiffness and swelling.     Return to your normal activities as told by your health care provider. Ask your health care provider what activities are safe for you.     Ask your health care provider when it is safe for you to drive if you have a sling or brace on your shoulder.     If you were shown how to do any exercises, do them as told by your health care provider.      General instructions     If directed, put ice on the affected area.  ? Put ice in a plastic bag.    ? Place a towel between your skin and the bag.    ? Leave the ice on for 20 minutes, 2?3 times  a day.       Take over-the-counter and prescription medicines only as told by your  health care provider.     Do not use any products that contain nicotine or tobacco, such as cigarettes, e-cigarettes, and chewing tobacco. These can delay healing. If you need help quitting, ask your health care provider.     Keep all follow-up visits as told by your health care provider. This is important.        Contact a health care provider if:     Your pain gets worse.     Your pain is not relieved with medicines.     You have increased redness or swelling.      Get help right away if:     You have a fever.     You cannot move your arm or shoulder.     You develop severe numbness or tingling in your arm, hand, or fingers.     Your arm, hand, or fingers feel cold and turn blue, white, or gray.      Summary     A shoulder sprain is a partial or complete tear in one of the tough, fiber-like tissues (ligaments) in the shoulder.     This condition may be caused by a fall, a hit to the shoulder, or a twist of the arm.     Treatment usually includes rest, ice, and pain medicine as needed.     If you have a sling or brace, wear it as told by your health care provider. Remove it only as told by your health care provider.      This information is not intended to replace advice given to you by your health care provider. Make sure you discuss any questions you have with your health care provider.      Document Revised: 01/28/2018 Document Reviewed: 01/28/2018  Elsevier Patient Education ? 2021 [employer]

## 2021-05-15 NOTE — ED Provider Notes (Signed)
General Medical Problem *ED        Patient:   Ralph Brock, Ralph Brock            MRN: [id number]            FIN: [id number]               Age:   75 years     Sex:  Male     DOB:  09-09-1945   Associated Diagnoses:   Osteoarthritis; Rotator cuff sprain   Author:   Salome Holmes JAMES-DO      Basic Information   Time seen: Provider Seen (ST)   ED Provider/Time:    Salome Holmes JAMES-DO / 05/15/2021 16:20  .   Additional information: Chief Complaint from Nursing Triage Note   Chief Complaint  Chief Complaint: pt with left shoulder and arm pain. states, "it's ok when I'm sleeping, but when I move it it almost kills me". states he's had pain for a couple days. [facility] and unable to give full medical hx (05/15/21 14:15:00).      History of Present Illness   75 year old male who presents over concerns of slightly worsening left shoulder pain after repeatedly having to move his wife back-and-forth during bed transfers to has had more dependence on him during the recent time.  Notes no fall no trauma no injury no numbness or tingling or weakness or other associate leg deformity overlying redness or warmth or concerns for underlying occult like infection, injury.  Notes no significant radiation of the pain does localize with majority of his left anterior and lateral shoulder.  No associated chest pain back pain or other concerning like anginal equivalents is better at rest only hurts when he palpates the upper shoulder when he moves it in a circular motion.  Is able to reach behind his back across the shoulder.  Otherwise well nonprogressive.  Improved with Tylenol rest ice.  No further alleviating or provoking concerns.      Review of Systems   Constitutional symptoms:  No fever, no chills, no sweats, no generalized weakness, no fatigue, no decreased activity.    Skin symptoms:  No jaundice, no rash.    Eye symptoms:  Negative except as documented in HPI.   ENMT symptoms:  Negative except as documented in HPI.   Respiratory  symptoms:  Negative except as documented in HPI.   Cardiovascular symptoms:  No chest pain, no palpitations, no tachycardia, no syncope, no diaphoresis.    Gastrointestinal symptoms:  No abdominal pain, no nausea, no vomiting, no diarrhea.    Genitourinary symptoms:  Negative except as documented in HPI.   Musculoskeletal symptoms:  Muscle pain, Joint pain, No back pain,    Neurologic symptoms:  No headache, no dizziness, no altered level of consciousness, no numbness, no tingling, no focal weakness.    Psychiatric symptoms:  No anxiety,              Additional review of systems information: All other systems reviewed and otherwise negative.      Health Status   Allergies:    Allergic Reactions (Selected)  Unknown  Sulfa drugs- Unknown..      Past Medical/ Family/ Social History   Medical history: Reviewed as documented in chart.   Surgical history: Reviewed as documented in chart.   Family history: Not significant.   Social history: Reviewed as documented in chart.   Problem list:    Active Problems (5)  Atrial  fibrillation   CVA (cerebrovascular accident)   Diabetes   Hypercholesteremia   Hypertension   .      Physical Examination               Vital Signs   Vital Signs   05/15/2021 14:15 EDT Systolic Blood Pressure 165 mmHg  HI    Diastolic Blood Pressure 94 mmHg  HI    Temperature Oral 36.5 degC    Heart Rate Monitored 88 bpm    Respiratory Rate 17 br/min    SpO2 99 %   .   Measurements   05/15/2021 14:20 EDT Body Mass Index est meas 39.07 kg/m2    Body Mass Index Measured 39.07 kg/m2   05/15/2021 14:15 EDT Height/Length Measured 181 cm    Weight Dosing 128 kg   .   Basic Oxygen Information   05/15/2021 14:15 EDT       SpO2                      99 %  .   General:  Alert, no acute distress.    Glasgow coma scale:  Total score: Total score: 15.   Neurological:  Alert and oriented to person, place, time, and situation, normal sensory observed, normal motor observed.    Skin:  Warm, intact.    Head:  Normocephalic,  atraumatic.    Neck:  Supple, no tenderness.    Eye:  Normal conjunctiva.   Ears, nose, mouth and throat:  Oral mucosa moist.   Cardiovascular:  No edema.   Respiratory:  Respirations are non-labored.   Musculoskeletal:  Normal ROM, normal strength, Minimal pain noted to the left anterior upper deltoid region on the left side near the [facility] joint no obvious deformity.  Normal sensation function compartments equal pulses 2+ cap refill is in 2 seconds..    Chest wall   Gastrointestinal:  Soft, Nontender.    Genitourinary   Psychiatric:  Cooperative.      Medical Decision Making   Radiology results:  Rad Results (ST)   XR Shoulder Complete Left  ?  05/15/21 16:44:33  Left shoulder AP internal rotation, Grashey, and Y-view: 05/15/21    INDICATION:Pain, Joint, Shoulder.    COMPARISON: None.    FINDINGS: No fracture or dislocation. Acromiohumeral distance preserved.  Moderately advanced glenohumeral osteoarthrosis with prominent inferior  osteophyte formation. Minimal AC joint osteoarthrosis.    IMPRESSION:  Moderately advanced glenohumeral osteoarthrosis.  ?  Signed By: Renard Hamper ALAN-MD  .      Reexamination/ Reevaluation   Course: improving.   Pain status: decreased.   Assessment: exam improved, Complicated osteoarthritis of the left shoulder.  Possibly underlying rotator cuff injury versus sprain versus complete tear.  We will continue outpatient anti-inflammatories topical agents and orthopedic follow-up as discussed.  No concerns for underlying occult infection or other concerning related cardiothoracic pulmonary issues.  Well-appearing nontoxic, will return for acute change or worsening without fail..      Impression and Plan   Diagnosis   Osteoarthritis (ICD10-CM M19.90, Discharge, Medical)   Rotator cuff sprain (ICD10-CM S43.429A, Discharge, Medical)   Plan   Condition: Improved, Stable.    Disposition: Discharged: to home.    Prescriptions: [facility] prescriptions   Pharmacy:  Naprosyn 500 mg oral tablet  (Prescribe): 500 mg, 1 tabs, Oral, BID, for 7 days, 14 tabs, 0 Refill(s)  diclofenac 1% topical gel (Prescribe): 1 app, Topical, QID, 100 g, 0 Refill(s).    Patient was  given the following educational materials: Arthritis, Shoulder Sprain.    Follow up with: [name] Within 1 week Return to ED if symptoms worsen.    Counseled: Patient, I had a detailed discussion with the patient and/or guardian regarding the historical points/exam findings supporting the discharge diagnosis and need for outpatient followup. Discussed the need to return to the ER if symptoms persist/worsen, or for any questions/concerns that arise at home.    Signature Line     Electronically Signed on 05/15/2021 05:16 PM EDT   ________________________________________________   Salome Holmes JAMES-DO               Modified by: Salome Holmes JAMES-DO on 05/15/2021 05:02 PM EDT      Modified by: Salome Holmes JAMES-DO on 05/15/2021 05:16 PM EDT

## 2021-05-15 NOTE — ED Notes (Signed)
ED Patient Education Note     Patient Education Materials Follows:  Orthopedics     Shoulder Sprain      A shoulder sprain is a partial or complete tear in one of the tough, fiber-like tissues (ligaments) in the shoulder. The ligaments in the shoulder help to hold the shoulder in place.      What are the causes?    This condition may be caused by:   A fall.     A hit to the shoulder.     A twist of the arm.        What increases the risk?    You are more likely to develop this condition if you:   Play sports.     Have problems with balance or coordination.        What are the signs or symptoms?    Symptoms of this condition include:   Pain when moving the shoulder.     Limited ability to move the shoulder.     Swelling and tenderness on top of the shoulder.     Warmth in the shoulder.     A change in the shape of the shoulder.     Redness or bruising on the shoulder.        How is this diagnosed?    This condition is diagnosed with:   A physical exam. During the exam, you may be asked to do simple exercises with your shoulder.     Imaging tests such as X-rays, MRI, or a CT scan. These tests can show how severe the sprain is.        How is this treated?    This condition may be treated with:   Rest.     Pain medicine.     Ice.     A sling or brace. This is used to keep the arm still while the shoulder is healing.     Physical therapy or rehabilitation exercises. These help to improve the range of motion and strength of the shoulder.     Surgery (rare). Surgery may be needed if the sprain caused a joint to become unstable. Surgery may also be needed to reduce pain.      Some people may develop ongoing shoulder pain or lose some range of motion in the shoulder. However, most people do not develop long-term problems.      Follow these instructions at home:      If you have a sling or brace:     Wear the sling or brace as told by your health care provider. Remove it only as told by your health care provider.     Loosen  the sling or brace if your fingers tingle, become numb, or turn cold and blue.     Keep the sling or brace clean.     If the sling or brace is not waterproof:  ? Do not let it get wet.    ? Cover it with a watertight covering when you take a bath or shower.        Activity     Rest your shoulder.     Move your arm only as much as told by your health care provider, but move your hand and fingers often to prevent stiffness and swelling.     Return to your normal activities as told by your health care provider. Ask your health care provider what activities are safe for you.       Ask your health care provider when it is safe for you to drive if you have a sling or brace on your shoulder.     If you were shown how to do any exercises, do them as told by your health care provider.      General instructions     If directed, put ice on the affected area.  ? Put ice in a plastic bag.    ? Place a towel between your skin and the bag.    ? Leave the ice on for 20 minutes, 2?3 times a day.       Take over-the-counter and prescription medicines only as told by your health care provider.     Do not use any products that contain nicotine or tobacco, such as cigarettes, e-cigarettes, and chewing tobacco. These can delay healing. If you need help quitting, ask your health care provider.     Keep all follow-up visits as told by your health care provider. This is important.        Contact a health care provider if:     Your pain gets worse.     Your pain is not relieved with medicines.     You have increased redness or swelling.      Get help right away if:     You have a fever.     You cannot move your arm or shoulder.     You develop severe numbness or tingling in your arm, hand, or fingers.     Your arm, hand, or fingers feel cold and turn blue, white, or gray.      Summary     A shoulder sprain is a partial or complete tear in one of the tough, fiber-like tissues (ligaments) in the shoulder.     This condition may be caused by a  fall, a hit to the shoulder, or a twist of the arm.     Treatment usually includes rest, ice, and pain medicine as needed.     If you have a sling or brace, wear it as told by your health care provider. Remove it only as told by your health care provider.      This information is not intended to replace advice given to you by your health care provider. Make sure you discuss any questions you have with your health care provider.      Document Revised: 01/28/2018 Document Reviewed: 01/28/2018  Elsevier Patient Education ? 2021 [employer]         Arthritis    Arthritis is a term that is commonly used to refer to joint pain or joint disease. There are more than 100 types of arthritis.      What are the causes?    The most common cause of this condition is wear and tear of a joint. Other causes include:   Gout.     Inflammation of a joint.     An infection of a joint.     Sprains and other injuries near the joint.     A reaction to medicines or drugs, or an allergic reaction.      In some cases, the cause may not be known.      What are the signs or symptoms?    The main symptom of this condition is pain in the joint during movement. Other symptoms include:   Redness, swelling, or stiffness at a joint.     Warmth coming from the joint.       Fever.     Overall feeling of illness.        How is this diagnosed?    This condition may be diagnosed with a physical exam and tests, including:   Blood tests.     Urine tests.     Imaging tests, such as X-rays, an MRI, or a CT scan.      Sometimes, fluid is removed from a joint for testing.      How is this treated?    This condition may be treated with:   Treatment of the cause, if it is known.     Rest.     Raising (elevating) the joint.     Applying cold or hot packs to the joint.     Medicines to improve symptoms and reduce inflammation.     Injections of a steroid such as cortisone into the joint to help reduce pain and inflammation.      Depending on the cause of your  arthritis, you may need to make lifestyle changes to reduce stress on your joint. Changes may include:   Exercising more.     Losing weight.        Follow these instructions at home:    Medicines     Take over-the-counter and prescription medicines only as told by your health care provider.     Do not take aspirin to relieve pain if your health care provider thinks that gout may be causing your pain.      Activity     Rest your joint if told by your health care provider. Rest is important when your disease is active and your joint feels painful, swollen, or stiff.     Avoid activities that make the pain worse. It is important to balance activity with rest.     Exercise your joint regularly with range-of-motion exercises as told by your health care provider. Try doing low-impact exercise, such as:  ? Swimming.    ? Water aerobics.    ? Biking.    ? Walking.        Managing pain, stiffness, and swelling         If directed, put ice on the joint.  ? Put ice in a plastic bag.    ? Place a towel between your skin and the bag.    ? Leave the ice on for 20 minutes, 2?3 times per day.       If your joint is swollen, raise (elevate) it above the level of your heart if directed by your health care provider.     If your joint feels stiff in the morning, try taking a warm shower.     If directed, apply heat to the affected area as often as told by your health care provider. Use the heat source that your health care provider recommends, such as a moist heat pack or a heating pad. If you have diabetes, do not apply heat without permission from your health care provider. To apply heat:  ? Place a towel between your skin and the heat source.    ? Leave the heat on for 20?30 minutes.    ? Remove the heat if your skin turns bright red. This is especially important if you are unable to feel pain, heat, or cold. You may have a greater risk of getting burned.        General instructions     Do not use any products that contain nicotine  or  tobacco, such as cigarettes, e-cigarettes, and chewing tobacco. If you need help quitting, ask your health care provider.     Keep all follow-up visits as told by your health care provider. This is important.        Contact a health care provider if:     The pain gets worse.     You have a fever.      Get help right away if:     You develop severe joint pain, swelling, or redness.     Many joints become painful and swollen.     You develop severe back pain.     You develop severe weakness in your leg.     You cannot control your bladder or bowels.      Summary     Arthritis is a term that is commonly used to refer to joint pain or joint disease. There are more than 100 types of arthritis.     The most common cause of this condition is wear and tear of a joint. Other causes include gout, inflammation or infection of the joint, sprains, or allergies.     Symptoms of this condition include redness, swelling, or stiffness of the joint. Other symptoms include warmth, fever, or feeling ill.     This condition is treated with rest, elevation, medicines, and applying cold or hot packs.     Follow your health care provider's instructions about medicines, activity, exercises, and other home care treatments.      This information is not intended to replace advice given to you by your health care provider. Make sure you discuss any questions you have with your health care provider.      Document Revised: 08/01/2018 Document Reviewed: 08/01/2018  Elsevier Patient Education ? 2021 Elsevier Inc[employer]

## 2021-05-15 NOTE — Discharge Summary (Signed)
 ED Clinical Summary                        [facility]  [address]  [place], [place], [address]  [phone]           PERSON INFORMATION  Name: Ralph Brock, Ralph Brock Age:  75 Years DOB: 1946/03/16   Sex: Male Language: English PCP: [name]   Marital Status: Married Phone: [phone] Med Service: MED-Medicine   MRN: [id number] Acct# [id number] Arrival: 05/15/2021 14:09:00   Visit Reason: Shoulder pain; LFT ARM/SHOULDER/CHEST PAIN Acuity: 3 LOS: 000 03:08   Address:    [address] [place] [address]   Diagnosis:    Osteoarthritis; Rotator cuff sprain  Medications:          New Medications  Printed Prescriptions  diclofenac topical (diclofenac 1% topical gel) 1 Application Topical (on the skin) 4 times a day. Refills: 0.  Last Dose:____________________  naproxen (Naprosyn 500 mg oral tablet) 1 Tabs Oral (given by mouth) 2 times a day for 7 Days. Refills: 0.  Last Dose:____________________      Medications Administered During Visit:                Medication Dose Route   acetaminophen 1000 mg Oral               Allergies      sulfa drugs (Unknown)      Major Tests and Procedures:  The following procedures and tests were performed during your ED visit.  COMMON PROCEDURES%>  COMMON PROCEDURES COMMENTS%>                PROVIDER INFORMATION               Provider Role Assigned Sampson LANE BARTER [facility] ED Provider 05/15/2021 16:20:31    Charma Lye A-RN ED Nurse 05/15/2021 16:21:25        Attending Physician:  LANE BARTER JAMES-DO      Admit Doc  POTTER,  ANDREW JAMES-DO     Consulting Doc       VITALS INFORMATION  Vital Sign Triage Latest   Temp Oral ORAL_1%> ORAL%>   Temp Temporal TEMPORAL_1%> TEMPORAL%>   Temp Intravascular INTRAVASCULAR_1%> INTRAVASCULAR%>   Temp Axillary AXILLARY_1%> AXILLARY%>   Temp Rectal RECTAL_1%> RECTAL%>   02 Sat 99 % 99 %   Respiratory Rate RATE_1%> RATE%>   Peripheral Pulse Rate PULSE RATE_1%> PULSE RATE%>   Apical Heart Rate HEART RATE_1%> HEART RATE%>    Blood Pressure BLOOD PRESSURE_1%>/ BLOOD PRESSURE_1%>94 mmHg BLOOD PRESSURE%> / BLOOD PRESSURE%>94 mmHg                 Immunizations      No Immunizations Documented This Visit          DISCHARGE INFORMATION   Discharge Disposition: H Outpt-Sent Home   Discharge Location:  Home   Discharge Date and Time:  05/15/2021 17:17:51   ED Checkout Date and Time:  05/15/2021 17:17:51     DEPART REASON INCOMPLETE INFORMATION               Depart Action Incomplete Reason   Interactive View/I&O Recently assessed               Problems      No Problems Documented              Smoking Status      No Smoking Status  Documented         PATIENT EDUCATION INFORMATION  Instructions:     Shoulder Sprain; Arthritis     Follow up:                   With: Address: When:   [name] [address], [address] [place], [place] [address]  [phone] Business (1) Within 1 week   Comments:   Return to ED if symptoms worsen              ED PROVIDER DOCUMENTATION     Patient:   Ralph Brock, Ralph Brock            MRN: [id number]            FIN: [id number]               Age:   75 years     Sex:  Male     DOB:  March 05, 1946   Associated Diagnoses:   Osteoarthritis; Rotator cuff sprain   Author:   LANE BARTER JAMES-DO      Basic Information   Time seen: Provider Seen (ST)   ED Provider/Time:    LANE BARTER JAMES-DO / 05/15/2021 16:20  .   Additional information: Chief Complaint from Nursing Triage Note   Chief Complaint  Chief Complaint: pt with left shoulder and arm pain. states, it's ok when I'm sleeping, but when I move it it almost kills me. states he's had pain for a couple days. [facility] and unable to give full medical hx (05/15/21 14:15:00).      History of Present Illness   75 year old male who presents over concerns of slightly worsening left shoulder pain after repeatedly having to move his wife back-and-forth during bed transfers to has had more dependence on him during the recent time.  Notes no fall no trauma no injury no  numbness or tingling or weakness or other associate leg deformity overlying redness or warmth or concerns for underlying occult like infection, injury.  Notes no significant radiation of the pain does localize with majority of his left anterior and lateral shoulder.  No associated chest pain back pain or other concerning like anginal equivalents is better at rest only hurts when he palpates the upper shoulder when he moves it in a circular motion.  Is able to reach behind his back across the shoulder.  Otherwise well nonprogressive.  Improved with Tylenol rest ice.  No further alleviating or provoking concerns.      Review of Systems   Constitutional symptoms:  No fever, no chills, no sweats, no generalized weakness, no fatigue, no decreased activity.    Skin symptoms:  No jaundice, no rash.    Eye symptoms:  Negative except as documented in HPI.   ENMT symptoms:  Negative except as documented in HPI.   Respiratory symptoms:  Negative except as documented in HPI.   Cardiovascular symptoms:  No chest pain, no palpitations, no tachycardia, no syncope, no diaphoresis.    Gastrointestinal symptoms:  No abdominal pain, no nausea, no vomiting, no diarrhea.    Genitourinary symptoms:  Negative except as documented in HPI.   Musculoskeletal symptoms:  Muscle pain, Joint pain, No back pain,    Neurologic symptoms:  No headache, no dizziness, no altered level of consciousness, no numbness, no tingling, no focal weakness.    Psychiatric symptoms:  No anxiety,  Additional review of systems information: All other systems reviewed and otherwise negative.      Health Status   Allergies:    Allergic Reactions (Selected)  Unknown  Sulfa drugs- Unknown..      Past Medical/ Family/ Social History   Medical history: Reviewed as documented in chart.   Surgical history: Reviewed as documented in chart.   Family history: Not significant.   Social history: Reviewed as documented in chart.   Problem list:    Active Problems  (5)  Atrial fibrillation   CVA (cerebrovascular accident)   Diabetes   Hypercholesteremia   Hypertension   .      Physical Examination               Vital Signs   Vital Signs   05/15/2021 14:15 EDT Systolic Blood Pressure 165 mmHg  HI    Diastolic Blood Pressure 94 mmHg  HI    Temperature Oral 36.5 degC    Heart Rate Monitored 88 bpm    Respiratory Rate 17 br/min    SpO2 99 %   .   Measurements   05/15/2021 14:20 EDT Body Mass Index est meas 39.07 kg/m2    Body Mass Index Measured 39.07 kg/m2   05/15/2021 14:15 EDT Height/Length Measured 181 cm    Weight Dosing 128 kg   .   Basic Oxygen Information   05/15/2021 14:15 EDT       SpO2                      99 %  .   General:  Alert, no acute distress.    Glasgow coma scale:  Total score: Total score: 15.   Neurological:  Alert and oriented to person, place, time, and situation, normal sensory observed, normal motor observed.    Skin:  Warm, intact.    Head:  Normocephalic, atraumatic.    Neck:  Supple, no tenderness.    Eye:  Normal conjunctiva.   Ears, nose, mouth and throat:  Oral mucosa moist.   Cardiovascular:  No edema.   Respiratory:  Respirations are non-labored.   Musculoskeletal:  Normal ROM, normal strength, Minimal pain noted to the left anterior upper deltoid region on the left side near the [facility] joint no obvious deformity.  Normal sensation function compartments equal pulses 2+ cap refill is in 2 seconds..    Chest wall   Gastrointestinal:  Soft, Nontender.    Genitourinary   Psychiatric:  Cooperative.      Medical Decision Making   Radiology results:  Rad Results (ST)   XR Shoulder Complete Left  ?  05/15/21 16:44:33  Left shoulder AP internal rotation, Grashey, and Y-view: 05/15/21    INDICATION:Pain, Joint, Shoulder.    COMPARISON: None.    FINDINGS: No fracture or dislocation. Acromiohumeral distance preserved.  Moderately advanced glenohumeral osteoarthrosis with prominent inferior  osteophyte formation. Minimal AC joint  osteoarthrosis.    IMPRESSION:  Moderately advanced glenohumeral osteoarthrosis.  ?  Signed By: STAN SENIOR ALAN-MD  .      Reexamination/ Reevaluation   Course: improving.   Pain status: decreased.   Assessment: exam improved, Complicated osteoarthritis of the left shoulder.  Possibly underlying rotator cuff injury versus sprain versus complete tear.  We will continue outpatient anti-inflammatories topical agents and orthopedic follow-up as discussed.  No concerns for underlying occult infection or other concerning related cardiothoracic pulmonary issues.  Well-appearing nontoxic, will return for acute change or worsening without fail.SABRA  Impression and Plan   Diagnosis   Osteoarthritis (ICD10-CM M19.90, Discharge, Medical)   Rotator cuff sprain (ICD10-CM S43.429A, Discharge, Medical)   Plan   Condition: Improved, Stable.    Disposition: Discharged: to home.    Prescriptions: Launch prescriptions   Pharmacy:  Naprosyn 500 mg oral tablet (Prescribe): 500 mg, 1 tabs, Oral, BID, for 7 days, 14 tabs, 0 Refill(s)  diclofenac 1% topical gel (Prescribe): 1 app, Topical, QID, 100 g, 0 Refill(s).    Patient was given the following educational materials: Arthritis, Shoulder Sprain.    Follow up with: [name] Within 1 week Return to ED if symptoms worsen.    Counseled: Patient, I had a detailed discussion with the patient and/or guardian regarding the historical points/exam findings supporting the discharge diagnosis and need for outpatient followup. Discussed the need to return to the ER if symptoms persist/worsen, or for any questions/concerns that arise at home.

## 2021-08-05 ENCOUNTER — Ambulatory Visit (INDEPENDENT_AMBULATORY_CARE_PROVIDER_SITE_OTHER): Payer: Medicare Other

## 2021-08-05 VITALS — Ht 72.0 in | Wt 200.0 lb

## 2021-08-05 DIAGNOSIS — Z0000 Encounter for general adult medical examination without abnormal findings: Secondary | ICD-10-CM

## 2021-08-05 NOTE — Patient Instructions (Signed)
Dennis Huff , Thank you for taking time to complete your Medicare Wellness Visit. I appreciate your ongoing commitment to your health goals. Please review the following plan we discussed and let me know if I can assist you in the future.   Screening recommendations/referrals: Colonoscopy: Completed 12/01/2017-Due 12/02/2027 Recommended yearly ophthalmology/optometry visit for glaucoma screening and checkup Recommended yearly dental visit for hygiene and checkup  Vaccinations: Influenza vaccine: Up to date Pneumococcal vaccine: Up to date Tdap vaccine: Up to date Shingles vaccine: Completed first dose   Covid-19: Up to date  Advanced directives: Information mailed today  Conditions/risks identified: See problem list  Next appointment: Follow up in one year for your annual wellness visit. 08/10/2022 @ 9:00.  Preventive Care 22 Years and Older, Male Preventive care refers to lifestyle choices and visits with your health care provider that can promote health and wellness. What does preventive care include? A yearly physical exam. This is also called an annual well check. Dental exams once or twice a year. Routine eye exams. Ask your health care provider how often you should have your eyes checked. Personal lifestyle choices, including: Daily care of your teeth and gums. Regular physical activity. Eating a healthy diet. Avoiding tobacco and drug use. Limiting alcohol use. Practicing safe sex. Taking low doses of aspirin every day. Taking vitamin and mineral supplements as recommended by your health care provider. What happens during an annual well check? The services and screenings done by your health care provider during your annual well check will depend on your age, overall health, lifestyle risk factors, and family history of disease. Counseling  Your health care provider may ask you questions about your: Alcohol use. Tobacco use. Drug use. Emotional well-being. Home and  relationship well-being. Sexual activity. Eating habits. History of falls. Memory and ability to understand (cognition). Work and work Statistician. Screening  You may have the following tests or measurements: Height, weight, and BMI. Blood pressure. Lipid and cholesterol levels. These may be checked every 5 years, or more frequently if you are over 39 years old. Skin check. Lung cancer screening. You may have this screening every year starting at age 30 if you have a 30-pack-year history of smoking and currently smoke or have quit within the past 15 years. Fecal occult blood test (FOBT) of the stool. You may have this test every year starting at age 90. Flexible sigmoidoscopy or colonoscopy. You may have a sigmoidoscopy every 5 years or a colonoscopy every 10 years starting at age 5. Prostate cancer screening. Recommendations will vary depending on your family history and other risks. Hepatitis C blood test. Hepatitis B blood test. Sexually transmitted disease (STD) testing. Diabetes screening. This is done by checking your blood sugar (glucose) after you have not eaten for a while (fasting). You may have this done every 1-3 years. Abdominal aortic aneurysm (AAA) screening. You may need this if you are a current or former smoker. Osteoporosis. You may be screened starting at age 26 if you are at high risk. Talk with your health care provider about your test results, treatment options, and if necessary, the need for more tests. Vaccines  Your health care provider may recommend certain vaccines, such as: Influenza vaccine. This is recommended every year. Tetanus, diphtheria, and acellular pertussis (Tdap, Td) vaccine. You may need a Td booster every 10 years. Zoster vaccine. You may need this after age 47. Pneumococcal 13-valent conjugate (PCV13) vaccine. One dose is recommended after age 65. Pneumococcal polysaccharide (PPSV23) vaccine. One dose  is recommended after age 15. Talk to your  health care provider about which screenings and vaccines you need and how often you need them. This information is not intended to replace advice given to you by your health care provider. Make sure you discuss any questions you have with your health care provider. Document Released: 09/20/2015 Document Revised: 05/13/2016 Document Reviewed: 06/25/2015 Elsevier Interactive Patient Education  2017 [place] Prevention in the Home Falls can cause injuries. They can happen to people of all ages. There are many things you can do to make your home safe and to help prevent falls. What can I do on the outside of my home? Regularly fix the edges of walkways and driveways and fix any cracks. Remove anything that might make you trip as you walk through a door, such as a raised step or threshold. Trim any bushes or trees on the path to your home. Use bright outdoor lighting. Clear any walking paths of anything that might make someone trip, such as rocks or tools. Regularly check to see if handrails are loose or broken. Make sure that both sides of any steps have handrails. Any raised decks and porches should have guardrails on the edges. Have any leaves, snow, or ice cleared regularly. Use sand or salt on walking paths during winter. Clean up any spills in your garage right away. This includes oil or grease spills. What can I do in the bathroom? Use night lights. Install grab bars by the toilet and in the tub and shower. Do not use towel bars as grab bars. Use non-skid mats or decals in the tub or shower. If you need to sit down in the shower, use a plastic, non-slip stool. Keep the floor dry. Clean up any water that spills on the floor as soon as it happens. Remove soap buildup in the tub or shower regularly. Attach bath mats securely with double-sided non-slip rug tape. Do not have throw rugs and other things on the floor that can make you trip. What can I do in the bedroom? Use night  lights. Make sure that you have a light by your bed that is easy to reach. Do not use any sheets or blankets that are too big for your bed. They should not hang down onto the floor. Have a firm chair that has side arms. You can use this for support while you get dressed. Do not have throw rugs and other things on the floor that can make you trip. What can I do in the kitchen? Clean up any spills right away. Avoid walking on wet floors. Keep items that you use a lot in easy-to-reach places. If you need to reach something above you, use a strong step stool that has a grab bar. Keep electrical cords out of the way. Do not use floor polish or wax that makes floors slippery. If you must use wax, use non-skid floor wax. Do not have throw rugs and other things on the floor that can make you trip. What can I do with my stairs? Do not leave any items on the stairs. Make sure that there are handrails on both sides of the stairs and use them. Fix handrails that are broken or loose. Make sure that handrails are as long as the stairways. Check any carpeting to make sure that it is firmly attached to the stairs. Fix any carpet that is loose or worn. Avoid having throw rugs at the top or bottom of the stairs.  If you do have throw rugs, attach them to the floor with carpet tape. Make sure that you have a light switch at the top of the stairs and the bottom of the stairs. If you do not have them, ask someone to add them for you. What else can I do to help prevent falls? Wear shoes that: Do not have high heels. Have rubber bottoms. Are comfortable and fit you well. Are closed at the toe. Do not wear sandals. If you use a stepladder: Make sure that it is fully opened. Do not climb a closed stepladder. Make sure that both sides of the stepladder are locked into place. Ask someone to hold it for you, if possible. Clearly mark and make sure that you can see: Any grab bars or handrails. First and last  steps. Where the edge of each step is. Use tools that help you move around (mobility aids) if they are needed. These include: Canes. Walkers. Scooters. Crutches. Turn on the lights when you go into a dark area. Replace any light bulbs as soon as they burn out. Set up your furniture so you have a clear path. Avoid moving your furniture around. If any of your floors are uneven, fix them. If there are any pets around you, be aware of where they are. Review your medicines with your doctor. Some medicines can make you feel dizzy. This can increase your chance of falling. Ask your doctor what other things that you can do to help prevent falls. This information is not intended to replace advice given to you by your health care provider. Make sure you discuss any questions you have with your health care provider. Document Released: 06/20/2009 Document Revised: 01/30/2016 Document Reviewed: 09/28/2014 Elsevier Interactive Patient Education  2017 [employer].

## 2021-08-05 NOTE — Progress Notes (Addendum)
Subjective:   Dennis Huff is a 75 y.o. male who presents for Medicare Annual/Subsequent preventive examination.  I connected with Dennis Huff today by telephone and verified that I am speaking with the correct person using two identifiers. Location patient: home Location provider: work Persons participating in the virtual visit: patient, Marine scientist.    I discussed the limitations, risks, security and privacy concerns of performing an evaluation and management service by telephone and the availability of in person appointments. I also discussed with the patient that there may be a patient responsible charge related to this service. The patient expressed understanding and verbally consented to this telephonic visit.    Interactive audio and video telecommunications were attempted between this provider and patient, however failed, due to patient having technical difficulties OR patient did not have access to video capability.  We continued and completed visit with audio only.  Some vital signs may be absent or patient reported.   Time Spent with patient on telephone encounter: 20 minutes   Review of Systems     Cardiac Risk Factors include: male gender;hypertension;dyslipidemia;advanced age (>23men, >23 women);sedentary lifestyle     Objective:    Today's Vitals   08/05/21 1142  Weight: 200 lb (90.7 kg)  Height: 6' (1.829 m)   Body mass index is 27.12 kg/m.  Advanced Directives 08/05/2021 06/21/2018 03/24/2018  Does Patient Have a Medical Advance Directive? No No Yes  Type of Advance Directive - Public librarian;Living will  Copy of [place] in Chart? - - No - copy requested  Would patient like information on creating a medical advance directive? Yes (MAU/Ambulatory/Procedural Areas - Information given) - -    Current Medications (verified) Outpatient Encounter Medications as of 08/05/2021  Medication Sig   amLODipine (NORVASC) 10 MG tablet Take 1  tablet (10 mg total) by mouth daily.   Multiple Vitamin (MULTIVITAMIN) tablet Take 1 tablet by mouth daily.   rosuvastatin (CRESTOR) 5 MG tablet Take 1 tablet (5 mg total) by mouth daily.   vitamin B-12 (CYANOCOBALAMIN) 1000 MCG tablet Take 1 tablet (1,000 mcg total) by mouth daily.   No facility-administered encounter medications on file as of 08/05/2021.    Allergies (verified) Patient has no known allergies.   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, Rx observation   Hyperlipidemia    Hypertension    Normal cardiac stress test 11-2010    done for an abnormal EKG   Prostate cancer ([place]) 11/2017   Past Surgical History:  Procedure Laterality Date   PROSTATE BIOPSY     WISDOM TOOTH EXTRACTION     Family History  Problem Relation Age of Onset   Diabetes Mother    Coronary artery disease Other        GF---> MI at around 74 y/o   Colon cancer Neg Hx    Stroke Neg Hx    Esophageal cancer Neg Hx    Liver cancer Neg Hx    Pancreatic cancer Neg Hx    Rectal cancer Neg Hx    Stomach cancer Neg Hx    Prostate cancer Neg Hx    Social History   Socioeconomic History   Marital status: Married    Spouse name: Not on file   Number of children: 2   Years  of education: Not on file   Highest education level: Not on file  Occupational History   Occupation: retired -- Engineer, building services: [employer]  Tobacco Use   Smoking status: Never   Smokeless tobacco: Never  Vaping Use   Vaping Use: Never used  Substance and Sexual Activity   Alcohol use: No   Drug use: No   Sexual activity: Not Currently  Other Topics Concern   Not on file  Social History Narrative   Lives w/ wife, lost mother in law ~ 2017   2 adult and independent children   lost a 68 y/o g-son 09/2019    Social Determinants of Radio broadcast assistant Strain: Low Risk     Difficulty of Paying Living Expenses: Not hard at all  Food Insecurity: No Food Insecurity   Worried About Charity fundraiser in the Last Year: Never true   Arboriculturist in the Last Year: Never true  Transportation Needs: No Transportation Needs   Lack of Transportation (Medical): No   Lack of Transportation (Non-Medical): No  Physical Activity: Inactive   Days of Exercise per Week: 0 days   Minutes of Exercise per Session: 0 min  Stress: No Stress Concern Present   Feeling of Stress : Not at all  Social Connections: Moderately Integrated   Frequency of Communication with Friends and Family: More than three times a week   Frequency of Social Gatherings with Friends and Family: More than three times a week   Attends Religious Services: More than 4 times per year   Active Member of [employer] or Organizations: No   Attends Music therapist: Never   Marital Status: Married    Tobacco Counseling Counseling given: Not Answered   Clinical Intake:  Pre-visit preparation completed: Yes  Pain : No/denies pain     BMI - recorded: 27.12 Nutritional Status: BMI 25 -29 Overweight Nutritional Risks: None Diabetes: No  How often do you need to have someone help you when you read instructions, pamphlets, or other written materials from your doctor or pharmacy?: 1 - Never  Diabetic?No  Interpreter Needed?: No  Information entered by :: Caroleen Hamman LPN   Activities of Daily Living In your present state of health, do you have any difficulty performing the following activities: 08/05/2021 04/25/2021  Hearing? N N  Vision? N N  Difficulty concentrating or making decisions? N N  Walking or climbing stairs? N N  Dressing or bathing? N N  Doing errands, shopping? N N  Preparing Food and eating ? N -  Using the Toilet? N -  In the past six months, have you accidently leaked urine? N -  Do you have problems with loss of bowel control? N -  Managing your Medications? N -   Managing your Finances? N -  Housekeeping or managing your Housekeeping? N -  Some recent data might be hidden    Patient Care Team: Colon Branch, MD as PCP - General Pilar Jarvis, Horald Pollen, MD (Inactive) as Consulting Physician (Urology) Alexis Frock, MD as Consulting Physician (Urology)  Indicate any recent Medical Services you may have received from other than [facility] providers in the past year (date may be approximate).     Assessment:   This is a routine wellness examination for Krish.  Hearing/Vision screen Hearing Screening - Comments:: No issues Vision Screening - Comments:: Last eye exam-03/2021-  Dietary issues and exercise activities discussed: Current Exercise Habits: The patient  does not participate in regular exercise at present, Exercise limited by: None identified   Goals Addressed             This Visit's Progress    Increase physical activity   Not on track      Depression Screen PHQ 2/9 Scores 08/05/2021 04/25/2021 12/31/2020 03/28/2020 03/28/2019 03/24/2018 03/22/2017  PHQ - 2 Score 0 0 0 0 0 0 0  PHQ- 9 Score - - - - 1 - -    Fall Risk Fall Risk  08/05/2021 04/25/2021 12/31/2020 03/28/2020 03/28/2019  Falls in the past year? 0 0 0 0 0  Number falls in past yr: 0 0 0 0 0  Injury with Fall? 0 0 0 0 -  Follow up Falls prevention discussed Falls evaluation completed - Falls evaluation completed -    FALL RISK PREVENTION PERTAINING TO THE HOME:  Any stairs in or around the home? Yes  If so, are there any without handrails? No  Home free of loose throw rugs in walkways, pet beds, electrical cords, etc? Yes  Adequate lighting in your home to reduce risk of falls? Yes   ASSISTIVE DEVICES UTILIZED TO PREVENT FALLS:  Life alert? No  Use of a cane, walker or w/c? No  Grab bars in the bathroom? Yes  Shower chair or bench in shower? No  Elevated toilet seat or a handicapped toilet? Yes   TIMED UP AND GO:  Was the test performed? No . Phone  visit   Cognitive Function:Normal cognitive status assessed by this Nurse Health Advisor. No abnormalities found.   MMSE - Mini Mental State Exam 03/24/2018  Orientation to time 5  Orientation to Place 5  Registration 3  Attention/ Calculation 5  Recall 3  Language- name 2 objects 2  Language- repeat 1  Language- follow 3 step command 3  Language- read & follow direction 1  Write a sentence 1  Copy design 1  Total score 30        Immunizations Immunization History  Administered Date(s) Administered   Fluad Quad(high Dose 65+) 07/02/2020   Influenza, High Dose Seasonal PF 06/26/2013   Influenza-Unspecified 06/24/2021   PFIZER(Purple Top)SARS-COV-2 Vaccination 09/29/2019, 10/20/2019, 06/03/2020, 01/01/2021   Pfizer Covid-19 Vaccine Bivalent Booster 5y-11y 06/24/2021   Pneumococcal Conjugate-13 01/12/2014   Pneumococcal Polysaccharide-23 04/30/2010, 03/28/2020   Td 03/09/2007, 03/22/2017   Zoster Recombinat (Shingrix) 03/28/2021   Zoster, Live 07/27/2011    TDAP status: Up to date  Flu Vaccine status: Up to date  Pneumococcal vaccine status: Up to date  Covid-19 vaccine status: Completed vaccines  Qualifies for Shingles Vaccine? No   Zostavax completed Yes   Shingrix Completed?: Yes first dose 03/28/2021  Screening Tests Health Maintenance  Topic Date Due   Zoster Vaccines- Shingrix (2 of 2) 05/23/2021   TETANUS/TDAP  03/23/2027   COLONOSCOPY (Pts 45-52yrs Insurance coverage will need to be confirmed)  12/02/2027   Pneumonia Vaccine 52+ Years old  Completed   INFLUENZA VACCINE  Completed   COVID-19 Vaccine  Completed   Hepatitis C Screening  Completed   HPV VACCINES  Aged Out    Health Maintenance  Health Maintenance Due  Topic Date Due   Zoster Vaccines- Shingrix (2 of 2) 05/23/2021    Colorectal cancer screening: Type of screening: Colonoscopy. Completed 12/01/2017. Repeat every 10 years  Lung Cancer Screening: (Low Dose CT Chest recommended if Age  71-80 years, 30 pack-year currently smoking OR have quit w/in 15years.) does not qualify.  Additional Screening:  Hepatitis C Screening: Completed 03/22/2017  Vision Screening: Recommended annual ophthalmology exams for early detection of glaucoma and other disorders of the eye. Is the patient up to date with their annual eye exam?  Yes  Who is the provider or what is the name of the office in which the patient attends annual eye exams? Patient unsure of name   Dental Screening: Recommended annual dental exams for proper oral hygiene  Community Resource Referral / Chronic Care Management: CRR required this visit?  No   CCM required this visit?  No      Plan:     I have personally reviewed and noted the following in the patient's chart:   Medical and social history Use of alcohol, tobacco or illicit drugs  Current medications and supplements including opioid prescriptions. Patient is not currently taking opioid prescriptions. Functional ability and status Nutritional status Physical activity Advanced directives List of other physicians Hospitalizations, surgeries, and ER visits in previous 12 months Vitals Screenings to include cognitive, depression, and falls Referrals and appointments  In addition, I have reviewed and discussed with patient certain preventive protocols, quality metrics, and best practice recommendations. A written personalized care plan for preventive services as well as general preventive health recommendations were provided to patient.   Due to this being a telephonic visit, the after visit summary with patients personalized plan was offered to patient via mail or my-chart. Per request, patient was mailed a copy of Gary, LPN   16/06/9603  Nurse Health Advisor  Nurse Notes: None  I have reviewed and agree with Health Coaches documentation.  Kathlene November, MD

## 2021-09-24 ENCOUNTER — Other Ambulatory Visit: Payer: Self-pay | Admitting: Internal Medicine

## 2022-01-08 ENCOUNTER — Other Ambulatory Visit: Payer: Self-pay | Admitting: Internal Medicine

## 2022-03-21 ENCOUNTER — Other Ambulatory Visit: Payer: Self-pay | Admitting: Internal Medicine

## 2022-04-10 ENCOUNTER — Other Ambulatory Visit: Payer: Self-pay | Admitting: Internal Medicine

## 2022-04-16 LAB — PSA: PSA: 0.18

## 2022-04-23 DIAGNOSIS — R3915 Urgency of urination: Secondary | ICD-10-CM | POA: Diagnosis not present

## 2022-04-24 ENCOUNTER — Encounter: Payer: Self-pay | Admitting: Internal Medicine

## 2022-04-28 ENCOUNTER — Encounter: Payer: Medicare Other | Admitting: Internal Medicine

## 2022-04-29 ENCOUNTER — Ambulatory Visit: Payer: Self-pay

## 2022-04-29 NOTE — Patient Outreach (Signed)
  Care Coordination   Initial Visit Note   04/29/2022 Name: MABLE LASHLEY MRN: [id number] DOB: June 02, 1946  ARBIE BLANKLEY is a 76 y.o. year old male who sees Larose Kells, Alda Berthold, MD for primary care. I spoke with  Cheryln Manly by phone today  What matters to the patients health and wellness today?  Patient denies any care management, care coordination, disease management or resource needs at this time.    Goals Addressed             This Visit's Progress    COMPLETED: Health Maintenance       Care Coordination Interventions: Advised patient to contact provider office to schedule upcoming AWV SDOH reviewed Discussed Care Coordination services and encouraged to contact RNCM if care management needs in the future.         SDOH assessments and interventions completed:  Yes  SDOH Interventions Today    Flowsheet Row Most Recent Value  SDOH Interventions   Food Insecurity Interventions Intervention Not Indicated  Transportation Interventions Intervention Not Indicated        Care Coordination Interventions Activated:  Yes  Care Coordination Interventions:  Yes, provided   Follow up plan: No further intervention required.   Encounter Outcome:  Pt. Visit Completed   Thea Silversmith, RN, MSN, BSN, CCM Care Coordinator [phone]

## 2022-05-13 ENCOUNTER — Ambulatory Visit (INDEPENDENT_AMBULATORY_CARE_PROVIDER_SITE_OTHER): Payer: Medicare Other | Admitting: Internal Medicine

## 2022-05-13 ENCOUNTER — Encounter: Payer: Self-pay | Admitting: Internal Medicine

## 2022-05-13 VITALS — BP 132/80 | HR 70 | Temp 98.2°F | Resp 16 | Ht 72.0 in | Wt 199.0 lb

## 2022-05-13 DIAGNOSIS — E785 Hyperlipidemia, unspecified: Secondary | ICD-10-CM

## 2022-05-13 DIAGNOSIS — R739 Hyperglycemia, unspecified: Secondary | ICD-10-CM | POA: Diagnosis not present

## 2022-05-13 DIAGNOSIS — I10 Essential (primary) hypertension: Secondary | ICD-10-CM

## 2022-05-13 DIAGNOSIS — Z0000 Encounter for general adult medical examination without abnormal findings: Secondary | ICD-10-CM

## 2022-05-13 LAB — CBC WITH DIFFERENTIAL/PLATELET
Basophils Absolute: 0 10*3/uL (ref 0.0–0.1)
Basophils Relative: 0.8 % (ref 0.0–3.0)
Eosinophils Absolute: 0.1 10*3/uL (ref 0.0–0.7)
Eosinophils Relative: 2.1 % (ref 0.0–5.0)
HCT: 40 % (ref 39.0–52.0)
Hemoglobin: 13.5 g/dL (ref 13.0–17.0)
Lymphocytes Relative: 23.6 % (ref 12.0–46.0)
Lymphs Abs: 1 10*3/uL (ref 0.7–4.0)
MCHC: 33.7 g/dL (ref 30.0–36.0)
MCV: 84.8 fl (ref 78.0–100.0)
Monocytes Absolute: 0.5 10*3/uL (ref 0.1–1.0)
Monocytes Relative: 11.5 % (ref 3.0–12.0)
Neutro Abs: 2.6 10*3/uL (ref 1.4–7.7)
Neutrophils Relative %: 62 % (ref 43.0–77.0)
Platelets: 218 10*3/uL (ref 150.0–400.0)
RBC: 4.71 Mil/uL (ref 4.22–5.81)
RDW: 13.5 % (ref 11.5–15.5)
WBC: 4.1 10*3/uL (ref 4.0–10.5)

## 2022-05-13 LAB — COMPREHENSIVE METABOLIC PANEL
ALT: 27 U/L (ref 0–53)
AST: 23 U/L (ref 0–37)
Albumin: 4.2 g/dL (ref 3.5–5.2)
Alkaline Phosphatase: 90 U/L (ref 39–117)
BUN: 17 mg/dL (ref 6–23)
CO2: 28 mEq/L (ref 19–32)
Calcium: 9.8 mg/dL (ref 8.4–10.5)
Chloride: 104 mEq/L (ref 96–112)
Creatinine, Ser: 1.17 mg/dL (ref 0.40–1.50)
GFR: 61.02 mL/min (ref >60.00)
Glucose, Bld: 92 mg/dL (ref 70–99)
Potassium: 4.2 mEq/L (ref 3.5–5.1)
Sodium: 140 mEq/L (ref 135–145)
Total Bilirubin: 0.9 mg/dL (ref 0.2–1.2)
Total Protein: 7 g/dL (ref 6.0–8.3)

## 2022-05-13 LAB — LIPID PANEL
Cholesterol: 152 mg/dL (ref 0–200)
HDL: 44.3 mg/dL (ref >39.00)
LDL Cholesterol: 92 mg/dL (ref 0–99)
NonHDL: 107.3
Total CHOL/HDL Ratio: 3
Triglycerides: 77 mg/dL (ref 0.0–149.0)
VLDL: 15.4 mg/dL (ref 0.0–40.0)

## 2022-05-13 LAB — TSH: TSH: 1.78 u[IU]/mL (ref 0.35–5.50)

## 2022-05-13 LAB — HEMOGLOBIN A1C: Hgb A1c MFr Bld: 6.2 % (ref 4.6–6.5)

## 2022-05-13 NOTE — Progress Notes (Addendum)
Subjective:    Patient ID: Dennis Huff, male    DOB: 03/18/1946, 76 y.o.   MRN: [id number]  DOS:  05/13/2022 Type of visit - description: CPX  Here for CPX.  Reports he is feeling well and has no concerns. Good med compliance. Normal amb BPs. 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   A1C 5.9    Cytopenia    saw hematology 4/11, Rx observation   Hyperlipidemia    Hypertension    Normal cardiac stress test 11-2010    done for an abnormal EKG   Prostate cancer ([place]) 11/2017    Past Surgical History:  Procedure Laterality Date   PROSTATE BIOPSY     WISDOM TOOTH EXTRACTION     Social History   Socioeconomic History   Marital status: Married    Spouse name: Not on file   Number of children: 2   Years of education: Not on file   Highest education level: Not on file  Occupational History   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    Employer: [employer]  Tobacco Use   Smoking status: Never   Smokeless tobacco: Never  Vaping Use   Vaping Use: Never used  Substance and Sexual Activity   Alcohol use: No   Drug use: No   Sexual activity: Not Currently  Other Topics Concern   Not on file  Social History Narrative   Lives w/ wife, lost mother in law ~ 2017   2 adult and independent children   lost a 61 y/o g-son 09/2019    Social Determinants of Health   Financial Resource Strain: Low Risk  (08/05/2021)   Overall Financial Resource Strain (CARDIA)    Difficulty of Paying Living Expenses: Not hard at all  Food Insecurity: No Food Insecurity (04/29/2022)   Hunger Vital Sign    Worried About Running Out of Food in the Last Year: Never true    Ran Out of Food in the Last Year: Never true  Transportation Needs: No Transportation Needs (04/29/2022)   PRAPARE - Hydrologist  (Medical): No    Lack of Transportation (Non-Medical): No  Physical Activity: Inactive (08/05/2021)   Exercise Vital Sign    Days of Exercise per Week: 0 days    Minutes of Exercise per Session: 0 min  Stress: No Stress Concern Present (08/05/2021)   [place]    Feeling of Stress : Not at all  Social Connections: Moderately Integrated (08/05/2021)   Social Connection and Isolation Panel [NHANES]    Frequency of Communication with Friends and Family: More than three times a week    Frequency of Social Gatherings with Friends and Family: More than three times a week    Attends Religious Services: More than 4 times per year    Active Member of [employer] or Organizations: No    Attends Archivist Meetings: Never    Marital Status: Married  Human resources officer Violence: Not At Risk (08/05/2021)   Humiliation, Afraid, Rape, and Kick questionnaire    Fear of Current or Ex-Partner: No    Emotionally Abused: No    Physically Abused: No    Sexually Abused: No  Current Outpatient Medications  Medication Instructions   amLODipine (NORVASC) 10 MG tablet TAKE 1 TABLET BY MOUTH DAILY   cyanocobalamin (VITAMIN B12) 1,000 mcg, Oral, Daily   Multiple Vitamin (MULTIVITAMIN) tablet 1 tablet, Daily   rosuvastatin (CRESTOR) 5 MG tablet TAKE 1 TABLET BY MOUTH DAILY       Objective:   Physical Exam BP 132/80   Pulse 70   Temp 98.2 F (36.8 C) (Oral)   Resp 16   Ht 6' (1.829 m)   Wt 199 lb (90.3 kg)   SpO2 97%   BMI 26.99 kg/m  General: Well developed, NAD, BMI noted Neck: No  thyromegaly  HEENT:  Normocephalic . Face symmetric, atraumatic Lungs:  CTA B Normal respiratory effort, no intercostal retractions, no accessory muscle use. Heart: RRR,  no murmur.  Abdomen:  Not distended, soft, non-tender. No rebound or rigidity.   Lower extremities: no pretibial edema bilaterally  Skin: Exposed areas without rash. Not  pale. Not jaundice Neurologic:  alert & oriented X3.  Speech normal, gait appropriate for age and unassisted Strength symmetric and appropriate for age.  Psych: Cognition and judgment appear intact.  Cooperative with normal attention span and concentration.  Behavior appropriate. No anxious or depressed appearing.     Assessment    Assessment HTN started meds 02/2018 Hyperglycemia, A1c 5.9  2010 Hyperlipidemia: Intolerant to Lipitor, Pravachol Prostate cancer: DX 11-2017, patient elected external XRT MSK: used to be on prn meloxicam Mild B12 deficiency-- oral supplements  Abnormal EKG, normal stress test 11-2010 Cytopenia, hematology evaluation 2011, Rx observation Sees PCP at the [place].   PLAN Here for CPX HTN, hyperglycemia, hyperlipidemia: Good compliance with meds, encouraged to check BPs regularly. Checking labs. RTC 1 year for CPX.

## 2022-05-13 NOTE — Patient Instructions (Addendum)
Recommend to proceed with the following vaccines:  Covid booster (bivalent) Flu shot- high dose  Check the  blood pressure regularly BP GOAL is between 110/65 and  135/85. If it is consistently higher or lower, let me know     GO TO THE LAB : Get the blood work     [place], [place] back for a physical exam in 1 year    "Living will", "Snead of attorney": Advanced care planning  (If you already have a living will or healthcare power of attorney, please bring the copy to be scanned in your chart.)  Advance care planning is a process that supports adults in  understanding and sharing their preferences regarding future medical care.   The patient's preferences are recorded in documents called Advance Directives.    Advanced directives are completed (and can be modified at any time) while the patient is in full mental capacity.   The documentation should be available at all times to the patient, the family and the healthcare providers.  Bring in a copy to be scanned in your chart is an excellent idea and is recommended   This legal documents direct treatment decision making and/or appoint a surrogate to make the decision if the patient is not capable to do so.    Advance directives can be documented in many types of formats,  documents have names such as:  Lliving will  Durable power of attorney for healthcare (healthcare proxy or healthcare power of attorney)  Combined directives  Physician orders for life-sustaining treatment    More information at:  [email or web address]

## 2022-05-14 ENCOUNTER — Encounter: Payer: Self-pay | Admitting: Internal Medicine

## 2022-05-14 NOTE — Assessment & Plan Note (Signed)
-  Td 03/22/2017  - pnm 23: 2011, 2021; prevnar 01-2014 - zostavax 2012 -  s/p shingrex x 2 - s/p covid vax x 4 , rec booster this fall - rec flu shot q year  -CCS: s/p several Cscopes per pt , Cscope 04-2007 normal (Dr Johna Roles), Cscope again 11/2017, 10 years per GI letter   - Prostate cancer:  sees  Urology ,  Per [facility] PSA was 0.1 last month. -Diet and exercise:  encouraged to take regular walks.  He is already eating healthy. - labs: CMP, FLP,CBC, A1c, TSH - POA: Discussed, see AVS

## 2022-07-10 ENCOUNTER — Other Ambulatory Visit: Payer: Self-pay | Admitting: Internal Medicine

## 2022-08-10 ENCOUNTER — Ambulatory Visit: Payer: Medicare Other

## 2022-12-24 ENCOUNTER — Other Ambulatory Visit: Payer: Self-pay | Admitting: Internal Medicine

## 2023-02-08 ENCOUNTER — Telehealth: Payer: Self-pay | Admitting: Internal Medicine

## 2023-02-08 NOTE — Telephone Encounter (Signed)
Copied from CRM [id number]. Topic: Medicare AWV >> Feb 08, 2023 10:20 AM Payton Doughty wrote: Reason for CRM: LM 02/08/2023 to schedule AWV   Verlee Rossetti; Care Guide Ambulatory Clinical Support [place] [facility] Direct Dial: [phone]

## 2023-03-04 ENCOUNTER — Other Ambulatory Visit: Payer: Self-pay | Admitting: Internal Medicine

## 2023-03-09 ENCOUNTER — Telehealth: Payer: Self-pay | Admitting: Internal Medicine

## 2023-03-09 NOTE — Telephone Encounter (Signed)
Copied from CRM [id number]. Topic: Medicare AWV >> Mar 09, 2023 10:08 AM Payton Doughty wrote: Reason for CRM: LM 03/09/2023 to schedule AWV   Verlee Rossetti; Care Guide Ambulatory Clinical Support [place] l [facility] Direct Dial: [phone]

## 2023-05-13 ENCOUNTER — Other Ambulatory Visit: Payer: Self-pay | Admitting: Internal Medicine

## 2023-05-14 ENCOUNTER — Other Ambulatory Visit: Payer: Self-pay | Admitting: Internal Medicine

## 2023-05-18 ENCOUNTER — Encounter: Payer: Self-pay | Admitting: Internal Medicine

## 2023-05-18 ENCOUNTER — Ambulatory Visit (INDEPENDENT_AMBULATORY_CARE_PROVIDER_SITE_OTHER): Payer: Medicare Other | Admitting: Internal Medicine

## 2023-05-18 VITALS — BP 132/72 | HR 64 | Temp 98.2°F | Resp 16 | Ht 72.0 in | Wt 200.4 lb

## 2023-05-18 DIAGNOSIS — Z0000 Encounter for general adult medical examination without abnormal findings: Secondary | ICD-10-CM | POA: Diagnosis not present

## 2023-05-18 DIAGNOSIS — R739 Hyperglycemia, unspecified: Secondary | ICD-10-CM

## 2023-05-18 DIAGNOSIS — E785 Hyperlipidemia, unspecified: Secondary | ICD-10-CM

## 2023-05-18 DIAGNOSIS — I10 Essential (primary) hypertension: Secondary | ICD-10-CM

## 2023-05-18 DIAGNOSIS — E538 Deficiency of other specified B group vitamins: Secondary | ICD-10-CM | POA: Diagnosis not present

## 2023-05-18 LAB — COMPREHENSIVE METABOLIC PANEL
ALT: 25 U/L (ref 0–53)
AST: 23 U/L (ref 0–37)
Albumin: 4.2 g/dL (ref 3.5–5.2)
Alkaline Phosphatase: 103 U/L (ref 39–117)
BUN: 13 mg/dL (ref 6–23)
CO2: 31 meq/L (ref 19–32)
Calcium: 9.8 mg/dL (ref 8.4–10.5)
Chloride: 105 meq/L (ref 96–112)
Creatinine, Ser: 1.09 mg/dL (ref 0.40–1.50)
GFR: 65.96 mL/min (ref >60.00)
Glucose, Bld: 74 mg/dL (ref 70–99)
Potassium: 4.7 meq/L (ref 3.5–5.1)
Sodium: 142 meq/L (ref 135–145)
Total Bilirubin: 0.8 mg/dL (ref 0.2–1.2)
Total Protein: 6.9 g/dL (ref 6.0–8.3)

## 2023-05-18 LAB — CBC WITH DIFFERENTIAL/PLATELET
Basophils Absolute: 0 10*3/uL (ref 0.0–0.1)
Basophils Relative: 0.2 % (ref 0.0–3.0)
Eosinophils Absolute: 0.1 10*3/uL (ref 0.0–0.7)
Eosinophils Relative: 2.9 % (ref 0.0–5.0)
HCT: 41.2 % (ref 39.0–52.0)
Hemoglobin: 13.5 g/dL (ref 13.0–17.0)
Lymphocytes Relative: 26 % (ref 12.0–46.0)
Lymphs Abs: 0.9 10*3/uL (ref 0.7–4.0)
MCHC: 32.7 g/dL (ref 30.0–36.0)
MCV: 86.5 fl (ref 78.0–100.0)
Monocytes Absolute: 0.4 10*3/uL (ref 0.1–1.0)
Monocytes Relative: 11.7 % (ref 3.0–12.0)
Neutro Abs: 2.1 10*3/uL (ref 1.4–7.7)
Neutrophils Relative %: 59.2 % (ref 43.0–77.0)
Platelets: 233 10*3/uL (ref 150.0–400.0)
RBC: 4.77 Mil/uL (ref 4.22–5.81)
RDW: 14 % (ref 11.5–15.5)
WBC: 3.6 10*3/uL — ABNORMAL LOW (ref 4.0–10.5)

## 2023-05-18 LAB — LIPID PANEL
Cholesterol: 158 mg/dL (ref 0–200)
HDL: 52.8 mg/dL (ref >39.00)
LDL Cholesterol: 91 mg/dL (ref 0–99)
NonHDL: 105.15
Total CHOL/HDL Ratio: 3
Triglycerides: 73 mg/dL (ref 0.0–149.0)
VLDL: 14.6 mg/dL (ref 0.0–40.0)

## 2023-05-18 LAB — HEMOGLOBIN A1C: Hgb A1c MFr Bld: 6.1 % (ref 4.6–6.5)

## 2023-05-18 LAB — B12 AND FOLATE PANEL
Folate: 13.6 ng/mL (ref >5.9)
Vitamin B-12: 213 pg/mL (ref 211–911)

## 2023-05-18 NOTE — Assessment & Plan Note (Signed)
Here for CPX HTN, hyperglycemia, hyperlipidemia, B12 deficiency:  Continue amlodipine, rosuvastatin, B12 supplements. Checking labs Social, Taking care of her his wife, she has DJD, less time for himself. RTC 1 year for CPX.

## 2023-05-18 NOTE — Assessment & Plan Note (Signed)
Here for CPX - Td 03/22/2017  - pnm 23: 2011, 2021; prevnar 01-2014; PNM 20: 2023  - zostavax 2012; s/p  -  shingrex    -Recommend: covid booster, flu shot -CCS: s/p several Cscopes per pt , Cscope 04-2007 normal (Dr Westley Gambles), Cscope again 11/2017, 10 years per GI letter   - Prostate cancer:  sees  Urology  - Diet doing okay, taking care of his wife, less time to exercise.  Counseled. - labs: CMP FLP CBC A1c B12 - POA: Discussed, see AVS

## 2023-05-18 NOTE — Patient Instructions (Addendum)
Vaccines I recommend:  Covid booster- new this fall Flu shot this fall   Check the  blood pressure regularly Blood pressure goal:  between 110/65 and  135/85. If it is consistently higher or lower, let me know     GO TO THE LAB : Get the blood work     GO TO THE FRONT DESK, PLEASE SCHEDULE YOUR APPOINTMENTS Come back for   physical exam in 1 year    "Health Care Power of attorney" ,  "Living will" (Advance care planning documents)  If you already have a living will or healthcare power of attorney, is recommended you bring the copy to be scanned in your chart.   The document will be available to all the doctors you see in the system.  Advance care planning is a process that supports adults in  understanding and sharing their preferences regarding future medical care.  The patient's preferences are recorded in documents called Advance Directives and the can be modified at any time while the patient is in full mental capacity.   If you don't have one, please consider create one.      More information at: [email or web address]

## 2023-05-18 NOTE — Progress Notes (Signed)
Subjective:    Patient ID: Dennis Huff, male    DOB: 08/13/1946, 77 y.o.   MRN: [id number]  DOS:  05/18/2023 Type of visit - description: Here for CPX  Has no concerns.  Specifically denies any GU symptoms.   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   A1C 5.9    Cytopenia    saw hematology 4/11, Rx observation   Hyperlipidemia    Hypertension    Normal cardiac stress test 11-2010    done for an abnormal EKG   Prostate cancer ([facility]) 11/2017    Past Surgical History:  Procedure Laterality Date   PROSTATE BIOPSY     WISDOM TOOTH EXTRACTION     Social History   Socioeconomic History   Marital status: Married    Spouse name: Not on file   Number of children: 2   Years of education: Not on file   Highest education level: Not on file  Occupational History   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    Employer: [employer]  Tobacco Use   Smoking status: Never   Smokeless tobacco: Never  Vaping Use   Vaping status: Never Used  Substance and Sexual Activity   Alcohol use: No   Drug use: No   Sexual activity: Not Currently  Other Topics Concern   Not on file  Social History Narrative   Lives w/ wife, lost mother in law ~ 2017   2 adult and independent children   lost a 83 y/o g-son 09/2019    Social Determinants of Health   Financial Resource Strain: Low Risk  (08/05/2021)   Overall Financial Resource Strain (CARDIA)    Difficulty of Paying Living Expenses: Not hard at all  Food Insecurity: No Food Insecurity (04/29/2022)   Hunger Vital Sign    Worried About Running Out of Food in the Last Year: Never true    Ran Out of Food in the Last Year: Never true  Transportation Needs: No Transportation Needs (04/29/2022)   PRAPARE - Administrator, Civil Service (Medical): No    Lack of Transportation (Non-Medical): No  Physical  Activity: Inactive (08/05/2021)   Exercise Vital Sign    Days of Exercise per Week: 0 days    Minutes of Exercise per Session: 0 min  Stress: No Stress Concern Present (08/05/2021)   [employer] of Occupational Health - Occupational Stress Questionnaire    Feeling of Stress : Not at all  Social Connections: Moderately Integrated (08/05/2021)   Social Connection and Isolation Panel [NHANES]    Frequency of Communication with Friends and Family: More than three times a week    Frequency of Social Gatherings with Friends and Family: More than three times a week    Attends Religious Services: More than 4 times per year    Active Member of [employer] or Organizations: No    Attends Banker Meetings: Never    Marital Status: Married  Catering manager Violence: Not At Risk (08/05/2021)   Humiliation, Afraid, Rape, and Kick questionnaire    Fear of Current or Ex-Partner: No    Emotionally Abused: No    Physically Abused: No    Sexually Abused: No     Current Outpatient Medications  Medication Instructions   amLODipine (NORVASC) 10 mg,  Oral, Daily   cyanocobalamin (VITAMIN B12) 1,000 mcg, Oral, Daily   Multiple Vitamin (MULTIVITAMIN) tablet 1 tablet, Daily   rosuvastatin (CRESTOR) 5 mg, Oral, Daily       Objective:   Physical Exam BP 132/72   Pulse 64   Temp 98.2 F (36.8 C) (Oral)   Resp 16   Ht 6' (1.829 m)   Wt 200 lb 6 oz (90.9 kg)   SpO2 97%   BMI 27.18 kg/m  General: Well developed, NAD, BMI noted Neck: No  thyromegaly  HEENT:  Normocephalic . Face symmetric, atraumatic Lungs:  CTA B Normal respiratory effort, no intercostal retractions, no accessory muscle use. Heart: RRR,  no murmur.  Abdomen:  Not distended, soft, non-tender. No rebound or rigidity.   Lower extremities: no pretibial edema bilaterally  Skin: Exposed areas without rash. Not pale. Not jaundice Neurologic:  alert & oriented X3.  Speech normal, gait appropriate for age and  unassisted Strength symmetric and appropriate for age.  Psych: Cognition and judgment appear intact.  Cooperative with normal attention span and concentration.  Behavior appropriate. No anxious or depressed appearing.     Assessment    Assessment HTN started meds 02/2018 Hyperglycemia, A1c 5.9  2010 Hyperlipidemia: Intolerant to Lipitor, Pravachol Prostate cancer: DX 11-2017, patient elected external XRT MSK: used to be on prn meloxicam Mild B12 deficiency-- oral supplements  Abnormal EKG, normal stress test 11-2010 Cytopenia, hematology evaluation 2011, Rx observation Sees PCP at the [place].   PLAN Here for CPX - Td 03/22/2017  - pnm 23: 2011, 2021; prevnar 01-2014; PNM 20: 2023  - zostavax 2012; s/p  -  shingrex    -Recommend: covid booster, flu shot -CCS: s/p several Cscopes per pt , Cscope 04-2007 normal (Dr Westley Gambles), Cscope again 11/2017, 10 years per GI letter   - Prostate cancer:  sees  Urology  - Diet doing okay, taking care of his wife, less time to exercise.  Counseled. - labs: CMP FLP CBC A1c B12 - POA: Discussed, see AVS HTN, hyperglycemia, hyperlipidemia, B12 deficiency:  Continue amlodipine, rosuvastatin, B12 supplements. Checking labs Social, Taking care of her his wife, she has DJD, less time for himself. RTC 1 year for CPX.

## 2023-05-27 LAB — PSA: PSA: 0.18

## 2023-06-03 DIAGNOSIS — R3915 Urgency of urination: Secondary | ICD-10-CM | POA: Diagnosis not present

## 2023-06-04 ENCOUNTER — Encounter: Payer: Self-pay | Admitting: Internal Medicine

## 2023-07-22 ENCOUNTER — Other Ambulatory Visit: Payer: Self-pay | Admitting: Internal Medicine

## 2023-09-30 ENCOUNTER — Other Ambulatory Visit: Payer: Self-pay | Admitting: Internal Medicine

## 2023-12-16 LAB — HM AWV

## 2024-01-18 ENCOUNTER — Encounter: Payer: Self-pay | Admitting: Internal Medicine

## 2024-03-10 ENCOUNTER — Other Ambulatory Visit: Payer: Self-pay | Admitting: Internal Medicine

## 2024-05-09 LAB — PSA: PSA: 0.3

## 2024-05-16 DIAGNOSIS — R3915 Urgency of urination: Secondary | ICD-10-CM | POA: Diagnosis not present

## 2024-05-17 ENCOUNTER — Encounter: Payer: Self-pay | Admitting: Internal Medicine

## 2024-05-19 ENCOUNTER — Encounter: Payer: Medicare Other | Admitting: Internal Medicine

## 2024-05-21 ENCOUNTER — Other Ambulatory Visit: Payer: Self-pay | Admitting: Internal Medicine

## 2024-06-05 ENCOUNTER — Encounter: Admitting: Internal Medicine

## 2024-06-06 ENCOUNTER — Encounter: Payer: Self-pay | Admitting: Internal Medicine

## 2024-06-06 ENCOUNTER — Ambulatory Visit (INDEPENDENT_AMBULATORY_CARE_PROVIDER_SITE_OTHER): Admitting: Internal Medicine

## 2024-06-06 VITALS — BP 126/64 | HR 74 | Temp 98.1°F | Resp 16 | Ht 72.0 in | Wt 200.5 lb

## 2024-06-06 DIAGNOSIS — E538 Deficiency of other specified B group vitamins: Secondary | ICD-10-CM

## 2024-06-06 DIAGNOSIS — E785 Hyperlipidemia, unspecified: Secondary | ICD-10-CM

## 2024-06-06 DIAGNOSIS — Z0000 Encounter for general adult medical examination without abnormal findings: Secondary | ICD-10-CM | POA: Diagnosis not present

## 2024-06-06 DIAGNOSIS — Z23 Encounter for immunization: Secondary | ICD-10-CM | POA: Diagnosis not present

## 2024-06-06 DIAGNOSIS — R739 Hyperglycemia, unspecified: Secondary | ICD-10-CM

## 2024-06-06 DIAGNOSIS — I10 Essential (primary) hypertension: Secondary | ICD-10-CM

## 2024-06-06 NOTE — Patient Instructions (Addendum)
 GO TO THE LAB :  Get the blood work    Then, go to the front desk for the checkout Please make an appointment for a physical exam in 1 year     You for your flu shot today Recommend a COVID-vaccine booster Recommend the RSV    Check the  blood pressure regularly Blood pressure goal:  between 110/65 and  135/85. If it is consistently higher or lower, let me know     Please read more detailed instructions below     YOUR PLAN: ADULT WELLNESS VISIT: Routine adult wellness visit with no new concerns. 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 -Continue current medications. -Schedule your next wellness visit in one year.  HYPERTENSION: Your blood pressure is well-controlled with your current medication. -Continue taking Amlodipine  10 mg by mouth daily. -We will check your labs.  HYPERLIPIDEMIA: Your cholesterol levels are managed with your current medication. -Continue taking Rosuvastatin  5 mg by mouth daily. -We will check your fasting lipid profile (FLP).  VITAMIN B12 DEFICIENCY: Your B12 levels are slightly low, and you are currently taking Cyanocobalamin . -Continue taking Cyanocobalamin  1,000 mcg by mouth daily. -We will check your B12 level.

## 2024-06-06 NOTE — Progress Notes (Unsigned)
 Subjective:    Patient ID: Dennis Huff, male    DOB: 1946/05/31, 78 y.o.   MRN: [id number]  DOS:  06/06/2024 Type of visit - description: CPX  Discussed the use of AI scribe software for clinical note transcription with the patient, who gave verbal consent to proceed.  History of Present Illness  General health status - No specific health concerns - Adheres to prescribed medications - No fever  Genitourinary symptoms - History of prostate cancer - Recent PSA level 0.3 - No urinary incontinence - Retains a small amount of urine without current concern  Cardiopulmonary symptoms - No chest pain - No respiratory difficulties  Gastrointestinal symptoms - No gastrointestinal issues - No nausea - No changes in stool  Functional status - Serves as caregiver for his wife, assisting with daily activities - No issues performing caregiving duties    Review of Systems   A 14 point review of systems is negative   Past Medical History:  Diagnosis Date   Allergy    B12 deficiency    mild    Borderline diabetes 01/2009   A1C 5.9    Cytopenia    saw hematology 4/11, Rx observation   Hyperlipidemia    Hypertension    Normal cardiac stress test 11-2010    done for an abnormal EKG   Prostate cancer ([facility]) 11/2017    Past Surgical History:  Procedure Laterality Date   PROSTATE BIOPSY     WISDOM TOOTH EXTRACTION      Current Outpatient Medications  Medication Instructions   amLODipine  (NORVASC ) 10 mg, Oral, Daily   cyanocobalamin  (VITAMIN B12) 1,000 mcg, Oral, Daily   Multiple Vitamin (MULTIVITAMIN) tablet 1 tablet, Daily   rosuvastatin  (CRESTOR ) 5 mg, Oral, Daily       Objective:   Physical Exam BP 126/64   Pulse 74   Temp 98.1 F (36.7 C) (Oral)   Resp 16   Ht 6' (1.829 m)   Wt 200 lb 8 oz (90.9 kg)   SpO2 95%   BMI 27.19 kg/m  General: Well developed, NAD, BMI noted Neck: No  thyromegaly  HEENT:  Normocephalic . Face symmetric, atraumatic Lungs:   CTA B Normal respiratory effort, no intercostal retractions, no accessory muscle use. Heart: RRR,  no murmur.  Abdomen:  Not distended, soft, non-tender. No rebound or rigidity.   Lower extremities: no pretibial edema bilaterally  Skin: Exposed areas without rash. Not pale. Not jaundice Neurologic:  alert & oriented X3.  Speech normal, gait appropriate for age and unassisted Strength symmetric and appropriate for age.  Psych: Cognition and judgment appear intact.  Cooperative with normal attention span and concentration.  Behavior appropriate. No anxious or depressed appearing.     Assessment     Assessment HTN started meds 02/2018 Hyperglycemia, A1c 5.9  2010 Hyperlipidemia: Intolerant to Lipitor, Pravachol  Prostate cancer: DX 11-2017, patient elected external XRT MSK: used to be on prn meloxicam  Mild B12 deficiency-- oral supplements  Abnormal EKG, normal stress test 11-2010 Cytopenia, hematology evaluation 2011, Rx observation Sees PCP at the [place].   Assessment & Plan Here for CPX - Td 03/22/2017  - pnm 23: 2011, 2021; prevnar 01-2014; PNM 20: 2023  - zostavax 2012; s/p  -  shingrex    - Flu shot today. - Recommend: covid booster, RSV -CCS: s/p several Cscopes per pt , Cscope 04-2007 normal (Dr Tonya), Cscope again 11/2017, 10 years per GI letter   - Prostate cancer:  sees  Urology  -  Lifestyle: Doing well - labs: CMP FLP CBC A1c b12  Caregiver for wife with arthritis, managing well.  Other issues Hypertension Blood pressure well-controlled on current medication regimen. - Continue Amlodipine  10 mg orally daily - Check labs Hyperlipidemia Hyperlipidemia managed with current medication. - Continue Rosuvastatin  5 mg orally daily - Check fasting lipid profile (FFP) Vitamin B12 deficiency Slightly low B12 levels, currently taking Cyanocobalamin  1,000 mcg orally daily.  Check B12 level RTC 1 year CPX

## 2024-06-07 ENCOUNTER — Encounter: Payer: Self-pay | Admitting: Internal Medicine

## 2024-06-07 LAB — COMPREHENSIVE METABOLIC PANEL WITH GFR
ALT: 20 U/L (ref 0–53)
AST: 20 U/L (ref 0–37)
Albumin: 4.1 g/dL (ref 3.5–5.2)
Alkaline Phosphatase: 83 U/L (ref 39–117)
BUN: 13 mg/dL (ref 6–23)
CO2: 29 meq/L (ref 19–32)
Calcium: 9.5 mg/dL (ref 8.4–10.5)
Chloride: 107 meq/L (ref 96–112)
Creatinine, Ser: 1.12 mg/dL (ref 0.40–1.50)
GFR: 63.38 mL/min (ref >60.00)
Glucose, Bld: 83 mg/dL (ref 70–99)
Potassium: 4.1 meq/L (ref 3.5–5.1)
Sodium: 143 meq/L (ref 135–145)
Total Bilirubin: 0.7 mg/dL (ref 0.2–1.2)
Total Protein: 6.5 g/dL (ref 6.0–8.3)

## 2024-06-07 LAB — CBC WITH DIFFERENTIAL/PLATELET
Basophils Absolute: 0 K/uL (ref 0.0–0.1)
Basophils Relative: 0.8 % (ref 0.0–3.0)
Eosinophils Absolute: 0.1 K/uL (ref 0.0–0.7)
Eosinophils Relative: 2 % (ref 0.0–5.0)
HCT: 39.4 % (ref 39.0–52.0)
Hemoglobin: 13.1 g/dL (ref 13.0–17.0)
Lymphocytes Relative: 24.4 % (ref 12.0–46.0)
Lymphs Abs: 0.7 K/uL (ref 0.7–4.0)
MCHC: 33.3 g/dL (ref 30.0–36.0)
MCV: 85.1 fl (ref 78.0–100.0)
Monocytes Absolute: 0.3 K/uL (ref 0.1–1.0)
Monocytes Relative: 10.6 % (ref 3.0–12.0)
Neutro Abs: 1.7 K/uL (ref 1.4–7.7)
Neutrophils Relative %: 62.2 % (ref 43.0–77.0)
Platelets: 215 K/uL (ref 150.0–400.0)
RBC: 4.63 Mil/uL (ref 4.22–5.81)
RDW: 13.7 % (ref 11.5–15.5)
WBC: 2.7 K/uL — ABNORMAL LOW (ref 4.0–10.5)

## 2024-06-07 LAB — LIPID PANEL
Cholesterol: 157 mg/dL (ref 0–200)
HDL: 47.7 mg/dL (ref >39.00)
LDL Cholesterol: 85 mg/dL (ref 0–99)
NonHDL: 109.58
Total CHOL/HDL Ratio: 3
Triglycerides: 121 mg/dL (ref 0.0–149.0)
VLDL: 24.2 mg/dL (ref 0.0–40.0)

## 2024-06-07 LAB — B12 AND FOLATE PANEL
Folate: 14.3 ng/mL (ref >5.9)
Vitamin B-12: 192 pg/mL — ABNORMAL LOW (ref 211–911)

## 2024-06-07 LAB — HEMOGLOBIN A1C: Hgb A1c MFr Bld: 6.1 % (ref 4.6–6.5)

## 2024-06-07 NOTE — Assessment & Plan Note (Signed)
 Here for CPX  Other issues Hypertension Blood pressure well-controlled on current medication regimen. - Continue Amlodipine  10 mg orally daily - Check labs Hyperlipidemia Hyperlipidemia managed with current medication. - Continue Rosuvastatin  5 mg orally daily - Check fasting lipid profile (FFP) Vitamin B12 deficiency Slightly low B12 levels, currently taking Cyanocobalamin  1,000 mcg orally daily.  Check B12 level RTC 1 year CPX

## 2024-06-07 NOTE — Assessment & Plan Note (Signed)
 Here for CPX - Td 03/22/2017  - pnm 23: 2011, 2021; prevnar 01-2014; PNM 20: 2023  - zostavax 2012; s/p  -  shingrex    - Flu shot today. 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

## 2024-06-08 ENCOUNTER — Ambulatory Visit: Payer: Self-pay | Admitting: Internal Medicine

## 2024-06-14 ENCOUNTER — Ambulatory Visit (INDEPENDENT_AMBULATORY_CARE_PROVIDER_SITE_OTHER): Admitting: *Deleted

## 2024-06-14 DIAGNOSIS — E538 Deficiency of other specified B group vitamins: Secondary | ICD-10-CM

## 2024-06-14 MED ORDER — CYANOCOBALAMIN 1000 MCG/ML IJ SOLN
1000.0000 ug | Freq: Once | INTRAMUSCULAR | Status: AC
  Administered 2024-06-14: 1000 ug via INTRAMUSCULAR

## 2024-06-14 MED ORDER — CYANOCOBALAMIN 1000 MCG/ML IJ SOLN: 1000.0000 ug | Freq: Once | INTRAMUSCULAR | 0 refills | Status: DC

## 2024-06-14 NOTE — Progress Notes (Signed)
 Patient here for monthly b12 injection per physicians order.  Injection given in left deltoid and patient tolerated well.

## 2024-06-21 ENCOUNTER — Ambulatory Visit (INDEPENDENT_AMBULATORY_CARE_PROVIDER_SITE_OTHER)

## 2024-06-21 DIAGNOSIS — E538 Deficiency of other specified B group vitamins: Secondary | ICD-10-CM

## 2024-06-21 MED ORDER — CYANOCOBALAMIN 1000 MCG/ML IJ SOLN
1000.0000 ug | Freq: Once | INTRAMUSCULAR | Status: AC
  Administered 2024-06-21: 1000 ug via INTRAMUSCULAR

## 2024-06-21 NOTE — Progress Notes (Signed)
 Pt here for weekly B12 injection per Amon  Last B12 injection: 06/14/2024  Last B12 level:  06/06/2024  B12 1000mcg given IM, and pt tolerated injection well.  Next B12 injection scheduled for: 06/28/2024

## 2024-06-27 NOTE — Progress Notes (Unsigned)
 Patient is here for a vitamin B12 injection per orders from Aloysius Mech, MD:  06/08/2024: B12 remains low, switch from oral to B12 injection weekly x 4 then monthly. All other labs look good  Last injection: 06/21/2024. Denies gastrointestinal problems or dizziness.  B12 injection to left deltoid with no apparent complications.  Scheduled next injection in one week for final weekly injection: 07/05/2024.

## 2024-06-28 ENCOUNTER — Ambulatory Visit (INDEPENDENT_AMBULATORY_CARE_PROVIDER_SITE_OTHER): Admitting: Neurology

## 2024-06-28 DIAGNOSIS — E538 Deficiency of other specified B group vitamins: Secondary | ICD-10-CM

## 2024-06-28 MED ORDER — CYANOCOBALAMIN 1000 MCG/ML IJ SOLN
1000.0000 ug | Freq: Once | INTRAMUSCULAR | Status: AC
  Administered 2024-06-28: 1000 ug via INTRAMUSCULAR

## 2024-07-05 ENCOUNTER — Ambulatory Visit

## 2024-07-05 DIAGNOSIS — E538 Deficiency of other specified B group vitamins: Secondary | ICD-10-CM

## 2024-07-05 MED ORDER — CYANOCOBALAMIN 1000 MCG/ML IJ SOLN
1000.0000 ug | Freq: Once | INTRAMUSCULAR | Status: AC
  Administered 2024-07-05: 1000 ug via INTRAMUSCULAR

## 2024-07-05 NOTE — Progress Notes (Signed)
 Pt here for 4/4 weekly B12 injection per Paz   Last B12 injection: 06/28/2024   Last B12 level:  06/06/2024   B12 1000mcg given IM, and pt tolerated injection well.   Next B12 injection scheduled for: 08/08/2024

## 2024-08-08 ENCOUNTER — Ambulatory Visit

## 2024-08-08 DIAGNOSIS — E538 Deficiency of other specified B group vitamins: Secondary | ICD-10-CM

## 2024-08-08 MED ORDER — CYANOCOBALAMIN 1000 MCG/ML IJ SOLN
1000.0000 ug | Freq: Once | INTRAMUSCULAR | Status: AC
  Administered 2024-08-08: 1000 ug via INTRAMUSCULAR

## 2024-08-08 NOTE — Progress Notes (Signed)
 Pt here for monthly B12 injection per PCP  Last B12 injection: 07/05/2024  Last B12 level: 06/06/2024  B12 1000mcg given IM L deltoid, and pt tolerated injection well.  Next B12 injection scheduled for: 09/12/2024

## 2024-09-12 ENCOUNTER — Ambulatory Visit (INDEPENDENT_AMBULATORY_CARE_PROVIDER_SITE_OTHER)

## 2024-09-12 DIAGNOSIS — E538 Deficiency of other specified B group vitamins: Secondary | ICD-10-CM

## 2024-09-12 MED ORDER — CYANOCOBALAMIN 1000 MCG/ML IJ SOLN
1000.0000 ug | Freq: Once | INTRAMUSCULAR | Status: AC
  Administered 2024-09-12: 1000 ug via INTRAMUSCULAR

## 2024-09-12 NOTE — Progress Notes (Signed)
 Pt here for monthly B12 injection per original order dated: 06/08/2024 per Dr. Amon B12 remains low, switch from oral to B12 injection weekly x 4 then monthly.   Last B12 injection:08/08/2024  Last B12 level: 06/06/2024   B12 1000mcg given IM, left deltoid and pt tolerated injection well.  Next B12 injection scheduled for: 10/13/2024

## 2024-10-13 ENCOUNTER — Ambulatory Visit

## 2024-10-13 DIAGNOSIS — E538 Deficiency of other specified B group vitamins: Secondary | ICD-10-CM

## 2024-10-13 MED ORDER — CYANOCOBALAMIN 1000 MCG/ML IJ SOLN
1000.0000 ug | Freq: Once | INTRAMUSCULAR | Status: AC
  Administered 2024-10-13: 1000 ug via INTRAMUSCULAR

## 2024-10-13 NOTE — Progress Notes (Signed)
 Pt here for monthly B12 injection per Dr. Amon   Last B12 injection: 09/12/2024  Last B12 level:  06/06/2024  B12 1000mcg given IM, and pt tolerated injection well.  Next B12 injection scheduled for: 11/14/24

## 2024-11-14 ENCOUNTER — Ambulatory Visit

## 2025-06-08 ENCOUNTER — Encounter: Admitting: Internal Medicine
